# Patient Record
Sex: Male | Born: 1970 | Race: White | Hispanic: No | Marital: Single | Smoking: Never smoker
Health system: Southern US, Community
[De-identification: ages and names within clinical notes are randomized; demographics above are authoritative.]

## PROBLEM LIST (undated history)

## (undated) DIAGNOSIS — E782 Mixed hyperlipidemia: Secondary | ICD-10-CM

## (undated) DIAGNOSIS — I219 Acute myocardial infarction, unspecified: Secondary | ICD-10-CM

## (undated) DIAGNOSIS — I10 Essential (primary) hypertension: Secondary | ICD-10-CM

## (undated) DIAGNOSIS — I5020 Unspecified systolic (congestive) heart failure: Secondary | ICD-10-CM

## (undated) DIAGNOSIS — I255 Ischemic cardiomyopathy: Secondary | ICD-10-CM

## (undated) DIAGNOSIS — I214 Non-ST elevation (NSTEMI) myocardial infarction: Secondary | ICD-10-CM

## (undated) DIAGNOSIS — R569 Unspecified convulsions: Secondary | ICD-10-CM

## (undated) DIAGNOSIS — E119 Type 2 diabetes mellitus without complications: Secondary | ICD-10-CM

## (undated) DIAGNOSIS — I2510 Atherosclerotic heart disease of native coronary artery without angina pectoris: Secondary | ICD-10-CM

## (undated) HISTORY — PX: SKIN GRAFT: SHX250

## (undated) HISTORY — PX: BACK SURGERY: SHX140

---

## 2008-12-21 ENCOUNTER — Emergency Department (HOSPITAL_COMMUNITY): Admission: EM | Admit: 2008-12-21 | Discharge: 2008-12-21 | Payer: Self-pay | Admitting: Emergency Medicine

## 2010-02-02 ENCOUNTER — Encounter: Payer: Self-pay | Admitting: Emergency Medicine

## 2010-02-02 ENCOUNTER — Inpatient Hospital Stay (HOSPITAL_COMMUNITY)
Admission: AD | Admit: 2010-02-02 | Discharge: 2010-02-04 | Payer: Self-pay | Source: Home / Self Care | Admitting: Internal Medicine

## 2010-02-07 ENCOUNTER — Ambulatory Visit: Payer: Self-pay | Admitting: Dentistry

## 2010-06-26 LAB — GLUCOSE, CAPILLARY
Glucose-Capillary: 168 mg/dL — ABNORMAL HIGH (ref 70–99)
Glucose-Capillary: 170 mg/dL — ABNORMAL HIGH (ref 70–99)
Glucose-Capillary: 239 mg/dL — ABNORMAL HIGH (ref 70–99)
Glucose-Capillary: 245 mg/dL — ABNORMAL HIGH (ref 70–99)
Glucose-Capillary: 277 mg/dL — ABNORMAL HIGH (ref 70–99)

## 2010-06-26 LAB — URINALYSIS, ROUTINE W REFLEX MICROSCOPIC
Glucose, UA: >1000 mg/dL — AB
Leukocytes, UA: NEGATIVE
Nitrite: NEGATIVE
Specific Gravity, Urine: 1.015 (ref 1.005–1.030)
pH: 5.5 (ref 5.0–8.0)

## 2010-06-26 LAB — CBC
HCT: 47.1 % (ref 39.0–52.0)
Hemoglobin: 15.9 g/dL (ref 13.0–17.0)
MCV: 82 fL (ref 78.0–100.0)
Platelets: 108 10*3/uL — ABNORMAL LOW (ref 150–400)
RBC: 5.21 MIL/uL (ref 4.22–5.81)
RDW: 12.8 % (ref 11.5–15.5)
WBC: 5.5 10*3/uL (ref 4.0–10.5)
WBC: 7.2 10*3/uL (ref 4.0–10.5)

## 2010-06-26 LAB — BASIC METABOLIC PANEL
Calcium: 8.9 mg/dL (ref 8.4–10.5)
Chloride: 109 mEq/L (ref 96–112)
Creatinine, Ser: 0.77 mg/dL (ref 0.4–1.5)
GFR calc Af Amer: >60 mL/min (ref >60)
GFR calc non Af Amer: >60 mL/min (ref >60)
Potassium: 4.6 mEq/L (ref 3.5–5.1)
Sodium: 137 mEq/L (ref 135–145)
Sodium: 138 mEq/L (ref 135–145)

## 2010-06-26 LAB — DIFFERENTIAL
Basophils Absolute: 0 10*3/uL (ref 0.0–0.1)
Lymphocytes Relative: 15 % (ref 12–46)
Monocytes Absolute: 0 10*3/uL — ABNORMAL LOW (ref 0.1–1.0)
Monocytes Relative: 0 % — ABNORMAL LOW (ref 3–12)
Neutro Abs: 4.6 10*3/uL (ref 1.7–7.7)
Neutrophils Relative %: 84 % — ABNORMAL HIGH (ref 43–77)

## 2010-06-26 LAB — URINE CULTURE: Culture  Setup Time: 201110222024

## 2010-06-26 LAB — CULTURE, BLOOD (ROUTINE X 2)
Culture  Setup Time: 201110250030
Culture: NO GROWTH

## 2010-06-26 LAB — LIPID PANEL: Cholesterol: 130 mg/dL (ref 0–200)

## 2010-06-26 LAB — URINE MICROSCOPIC-ADD ON

## 2010-06-26 LAB — LACTIC ACID, PLASMA: Lactic Acid, Venous: 8.5 mmol/L — ABNORMAL HIGH (ref 0.5–2.2)

## 2010-06-26 LAB — MRSA PCR SCREENING: MRSA by PCR: NEGATIVE

## 2011-10-07 IMAGING — CR DG CHEST 1V PORT
1 series · 1 of 1 positions shown · non-contrast
Comparison: None

CLINICAL DATA: Shortness of breath and chills.

PORTABLE CHEST - 1 VIEW

[view not recorded]
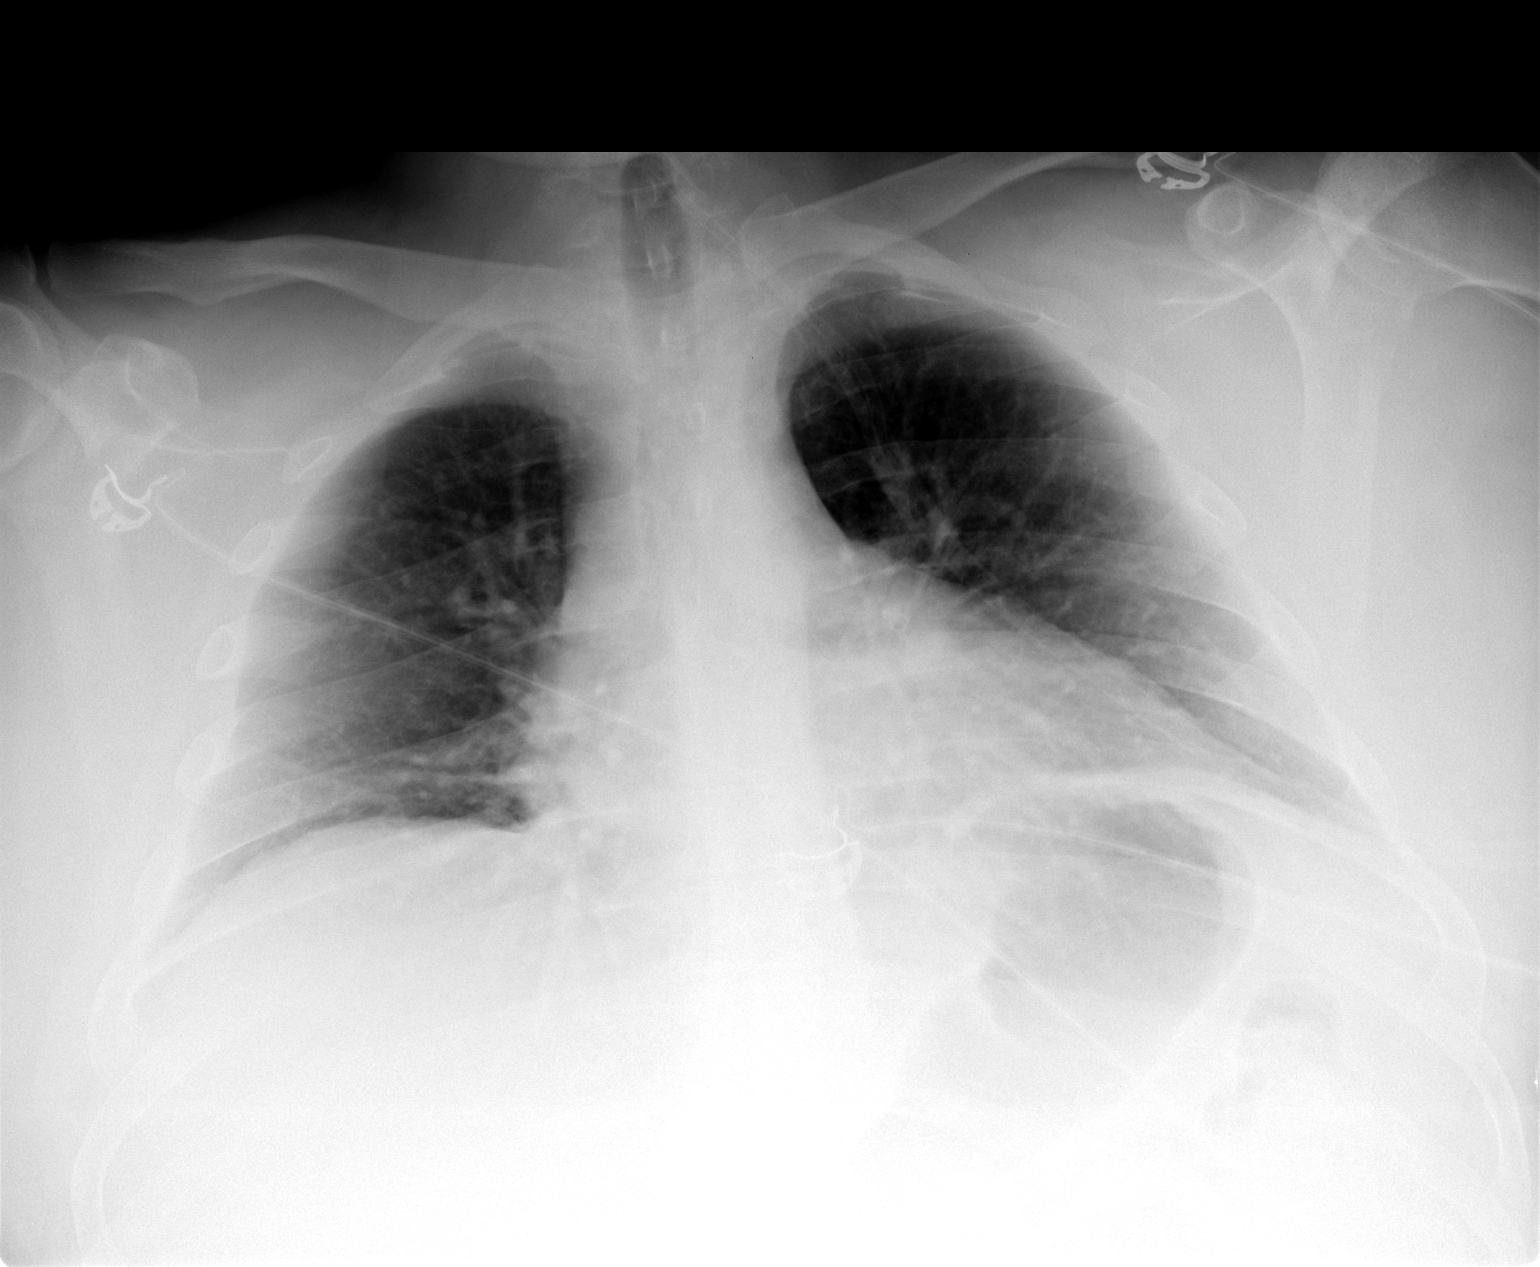

[1 of 1 positions shown; findings below may reference images not displayed]

FINDINGS: The heart is enlarged.  The mediastinal and hilar
contours are within normal limits.  Low lung volumes with mild
vascular crowding and bibasilar atelectasis.  No infiltrates, edema
or effusions.  No pneumothorax.  The bony thorax is intact.
IMPRESSION: 1.  Mild cardiac enlargement.
2.  No acute pulmonary findings.

## 2015-01-04 ENCOUNTER — Emergency Department: Payer: BLUE CROSS/BLUE SHIELD

## 2015-01-04 ENCOUNTER — Encounter: Payer: Self-pay | Admitting: Emergency Medicine

## 2015-01-04 DIAGNOSIS — E119 Type 2 diabetes mellitus without complications: Secondary | ICD-10-CM | POA: Diagnosis not present

## 2015-01-04 DIAGNOSIS — I10 Essential (primary) hypertension: Secondary | ICD-10-CM | POA: Diagnosis not present

## 2015-01-04 DIAGNOSIS — Y9389 Activity, other specified: Secondary | ICD-10-CM | POA: Diagnosis not present

## 2015-01-04 DIAGNOSIS — Y998 Other external cause status: Secondary | ICD-10-CM | POA: Diagnosis not present

## 2015-01-04 DIAGNOSIS — S62302A Unspecified fracture of third metacarpal bone, right hand, initial encounter for closed fracture: Secondary | ICD-10-CM | POA: Diagnosis not present

## 2015-01-04 DIAGNOSIS — S6991XA Unspecified injury of right wrist, hand and finger(s), initial encounter: Secondary | ICD-10-CM | POA: Diagnosis present

## 2015-01-04 DIAGNOSIS — X58XXXA Exposure to other specified factors, initial encounter: Secondary | ICD-10-CM | POA: Diagnosis not present

## 2015-01-04 DIAGNOSIS — Y9289 Other specified places as the place of occurrence of the external cause: Secondary | ICD-10-CM | POA: Diagnosis not present

## 2015-01-04 NOTE — ED Notes (Signed)
Pt presents to ED with possible dislocation to the knuckle on the 3rd digit on top of his right hand. Pt states he went to "thump" his son and when he pulled his finger down the knuckle pulled to the left and is very painful. Pain increases with movement.

## 2015-01-05 ENCOUNTER — Emergency Department
Admission: EM | Admit: 2015-01-05 | Discharge: 2015-01-05 | Disposition: A | Discharge status: Home / Self Care | Payer: BLUE CROSS/BLUE SHIELD | Attending: Emergency Medicine | Admitting: Emergency Medicine

## 2015-01-05 ENCOUNTER — Telehealth: Payer: Self-pay | Admitting: Medical Oncology

## 2015-01-05 DIAGNOSIS — X58XXXA Exposure to other specified factors, initial encounter: Secondary | ICD-10-CM | POA: Insufficient documentation

## 2015-01-05 DIAGNOSIS — Y9289 Other specified places as the place of occurrence of the external cause: Secondary | ICD-10-CM | POA: Insufficient documentation

## 2015-01-05 DIAGNOSIS — Y9389 Activity, other specified: Secondary | ICD-10-CM | POA: Insufficient documentation

## 2015-01-05 DIAGNOSIS — I10 Essential (primary) hypertension: Secondary | ICD-10-CM | POA: Insufficient documentation

## 2015-01-05 DIAGNOSIS — Y998 Other external cause status: Secondary | ICD-10-CM | POA: Insufficient documentation

## 2015-01-05 DIAGNOSIS — S62302A Unspecified fracture of third metacarpal bone, right hand, initial encounter for closed fracture: Secondary | ICD-10-CM

## 2015-01-05 DIAGNOSIS — E119 Type 2 diabetes mellitus without complications: Secondary | ICD-10-CM | POA: Insufficient documentation

## 2015-01-05 HISTORY — DX: Essential (primary) hypertension: I10

## 2015-01-05 HISTORY — DX: Unspecified convulsions: R56.9

## 2015-01-05 HISTORY — DX: Type 2 diabetes mellitus without complications: E11.9

## 2015-01-05 MED ORDER — OXYCODONE-ACETAMINOPHEN 5-325 MG PO TABS: 1.0000 | ORAL_TABLET | Freq: Once | ORAL | Status: DC

## 2015-01-05 MED ORDER — OXYCODONE-ACETAMINOPHEN 5-325 MG PO TABS
1.0000 | ORAL_TABLET | Freq: Once | ORAL | Status: AC
  Administered 2015-01-05: 1 via ORAL
  Filled 2015-01-05: qty 1

## 2015-01-05 NOTE — ED Notes (Signed)
Pt here with pain to his right hand. Pt states that he was playing with his son PTA and that he felt it pop. Pt stats that he has pain when bending his finger. Pt does not have any noted swelling. Pt is in NAD at this time.

## 2015-01-05 NOTE — Discharge Instructions (Signed)
Hand Fracture, Metacarpals °Fractures of metacarpals are breaks in the bones of the hand. They extend from the knuckles to the wrist. These bones can undergo many types of fractures. There are different ways of treating these fractures, all of which may be correct. °TREATMENT  °Hand fractures can be treated with:  °· Non-reduction - The fracture is casted without changing the positions of the fracture (bone pieces) involved. This fracture is usually left in a cast for 4 to 6 weeks or as your caregiver thinks necessary. °· Closed reduction - The bones are moved back into position without surgery and then casted. °· ORIF (open reduction and internal fixation) - The fracture site is opened and the bone pieces are fixed into place with some type of hardware, such as screws, etc. They are then casted. °Your caregiver will discuss the type of fracture you have and the treatment that should be best for that problem. If surgery is chosen, let your caregivers know about the following.  °LET YOUR CAREGIVERS KNOW ABOUT: °· Allergies. °· Medications you are taking, including herbs, eye drops, over the counter medications, and creams. °· Use of steroids (by mouth or creams). °· Previous problems with anesthetics or novocaine. °· Possibility of pregnancy. °· History of blood clots (thrombophlebitis). °· History of bleeding or blood problems. °· Previous surgeries. °· Other health problems. °AFTER THE PROCEDURE °After surgery, you will be taken to the recovery area where a nurse will watch and check your progress. Once you are awake, stable, and taking fluids well, barring other problems, you'll be allowed to go home. Once home, an ice pack applied to your operative site may help with pain and keep the swelling down. °HOME CARE INSTRUCTIONS  °· Follow your caregiver's instructions as to activities, exercises, physical therapy, and driving a car. °· Daily exercise is helpful for keeping range of motion and strength. Exercise as  instructed. °· To lessen swelling, keep the injured hand elevated above the level of your heart as much as possible. °· Apply ice to the injury for 15-20 minutes each hour while awake for the first 2 days. Put the ice in a plastic bag and place a thin towel between the bag of ice and your cast. °· Move the fingers of your casted hand several times a day. °· If a plaster or fiberglass cast was applied: °¨ Do not try to scratch the skin under the cast using a sharp or pointed object. °¨ Check the skin around the cast every day. You may put lotion on red or sore areas. °¨ Keep your cast dry. Your cast can be protected during bathing with a plastic bag. Do not put your cast into the water. °· If a plaster splint was applied: °¨ Wear your splint for as long as directed by your caregiver or until seen again. °¨ Do not get your splint wet. Protect it during bathing with a plastic bag. °¨ You may loosen the elastic bandage around the splint if your fingers start to get numb, tingle, get cold or turn blue. °· Do not put pressure on your cast or splint; this may cause it to break. Especially, do not lean plaster casts on hard surfaces for 24 hours after application. °· Take medications as directed by your caregiver. °· Only take over-the-counter or prescription medicines for pain, discomfort, or fever as directed by your caregiver. °· Follow-up as provided by your caregiver. This is very important in order to avoid permanent injury or disability and chronic   pain. °SEEK MEDICAL CARE IF:  °· Increased bleeding (more than a small spot) from beneath your cast or splint if there is beneath the cast as with an open reduction. °· Redness, swelling, or increasing pain in the wound or from beneath your cast or splint. °· Pus coming from wound or from beneath your cast or splint. °· An unexplained oral temperature above 102° F (38.9° C) develops, or as your caregiver suggests. °· A foul smell coming from the wound or dressing or from  beneath your cast or splint. °· You have a problem moving any of your fingers. °SEEK IMMEDIATE MEDICAL CARE IF:  °· You develop a rash °· You have difficulty breathing °· You have any allergy problems °If you do not have a window in your cast for observing the wound, a discharge or minor bleeding may show up as a stain on the outside of your cast. Report these findings to your caregiver. °MAKE SURE YOU:  °· Understand these instructions. °· Will watch your condition. °· Will get help right away if you are not doing well or get worse. °Document Released: 03/31/2005 Document Revised: 06/23/2011 Document Reviewed: 11/18/2007 °[employer]® Patient Information ©2015 [employer]. This information is not intended to replace advice given to you by your health care provider. Make sure you discuss any questions you have with your health care provider. ° °

## 2015-01-05 NOTE — ED Provider Notes (Signed)
[facility] Emergency Department Provider Note  ____________________________________________  Time seen: 1:30 AM  I have reviewed the triage vital signs and the nursing notes.   HISTORY  Chief Complaint Hand Injury      HPI Douglas Young is a 44 y.o. male presents with history of injuring his third knuckle while attempting to "thump" his son. Patient admits to currently 7 out of 10 pain located at the proximal portion of the third finger. Pain is worsened with making a fist.    Past Medical History  Diagnosis Date  . Diabetes mellitus without complication   . Hypertension   . Seizures     There are no active problems to display for this patient.   Past Surgical History  Procedure Laterality Date  . Skin graft    . Back surgery      Current Outpatient Rx  Name  Route  Sig  Dispense  Refill  . oxyCODONE-acetaminophen (PERCOCET/ROXICET) 5-325 MG per tablet   Oral   Take 1 tablet by mouth once.   20 tablet   0     Allergies Prednisone  No family history on file.  Social History Social History  Substance Use Topics  . Smoking status: Never Smoker   . Smokeless tobacco: Current User  . Alcohol Use: No    Review of Systems  Constitutional: Negative for fever. Eyes: Negative for visual changes. ENT: Negative for sore throat. Cardiovascular: Negative for chest pain. Respiratory: Negative for shortness of breath. Gastrointestinal: Negative for abdominal pain, vomiting and diarrhea. Genitourinary: Negative for dysuria. Musculoskeletal: Positive for right third finger pain Skin: Negative for rash. Neurological: Negative for headaches, focal weakness or numbness.   10-point ROS otherwise negative.  ____________________________________________   PHYSICAL EXAM:  VITAL SIGNS: ED Triage Vitals  Enc Vitals Group     BP 01/04/15 2334 168/94 mmHg     Pulse Rate 01/04/15 2334 80     Resp 01/04/15 2334 20     Temp 01/04/15 2334  98.3 F (36.8 C)     Temp Source 01/04/15 2334 Oral     SpO2 01/04/15 2334 98 %     Weight 01/04/15 2334 230 lb (104.327 kg)     Height 01/04/15 2334  (1.778 m)     Head Cir --      Peak Flow --      Pain Score 01/04/15 2335 1     Pain Loc --      Pain Edu? --      Excl. in GC? --      Constitutional: Alert and oriented. Well appearing and in no distress. Eyes: Conjunctivae are normal. PERRL. Normal extraocular movements. ENT   Head: Normocephalic and atraumatic.   Nose: No congestion/rhinnorhea.   Mouth/Throat: Mucous membranes are moist.   Neck: No stridor. Musculoskeletal: Deformity noted at the base of the third finger unusual contour to the knuckle as well as noted deviation of the tendon with making a fist.  Neurologic:  Normal speech and language. No Payeton Germani focal neurologic deficits are appreciated. Speech is normal.  Skin:  Skin is warm, dry and intact. No rash noted. Psychiatric: Mood and affect are normal. Speech and behavior are normal. Patient exhibits appropriate insight and judgment.  ____________________________________________    LABS (pertinent positives/negatives)     _______ Hand Complete Right (Final result) Result time: 01/05/15 00:27:41   Final result by Rad Results In Interface (01/05/15 00:27:41)   Narrative:   CLINICAL DATA: Injury  to third digit RIGHT hand, question dislocation, pain increased with movement  EXAM: RIGHT HAND - COMPLETE 3+ VIEW  COMPARISON: None  FINDINGS: Osseous mineralization normal.  Joint spaces preserved.  No fracture, dislocation, or bone destruction.  IMPRESSION: Normal exam.   Electronically Signed By: Ulyses Southward M.D. On: 01/05/2015 00:27     INITIAL IMPRESSION / ASSESSMENT AND PLAN / ED COURSE  Pertinent labs & imaging results that were available during my care of the patient were reviewed by me and considered in my medical decision making (see chart for  details).  Splint applied to the third finger of the right hand. Although x-ray did not reveal any fracture or dislocation clinically the patient has a fracture of his distal third metacarpal with tendon deviation. As such patient is being referred to Dr. Martha Clan orthopedic surgeon for further evaluation.  ____________________________________________   FINAL CLINICAL IMPRESSION(S) / ED DIAGNOSES  Final diagnoses:  Fracture of third metacarpal bone of right hand, closed, initial encounter      Darci Current, MD 01/05/15 [phone]

## 2016-09-07 IMAGING — CR DG HAND COMPLETE 3+V*R*
1 series · 3 of 3 positions shown · non-contrast
Comparison: None

CLINICAL DATA: Injury to third digit RIGHT hand, question
dislocation, pain increased with movement

EXAM:
RIGHT HAND - COMPLETE 3+ VIEW

[Series 1: x hand pa right · 0.14mm/px · 3 of 3 slices shown]
[im 1/3]
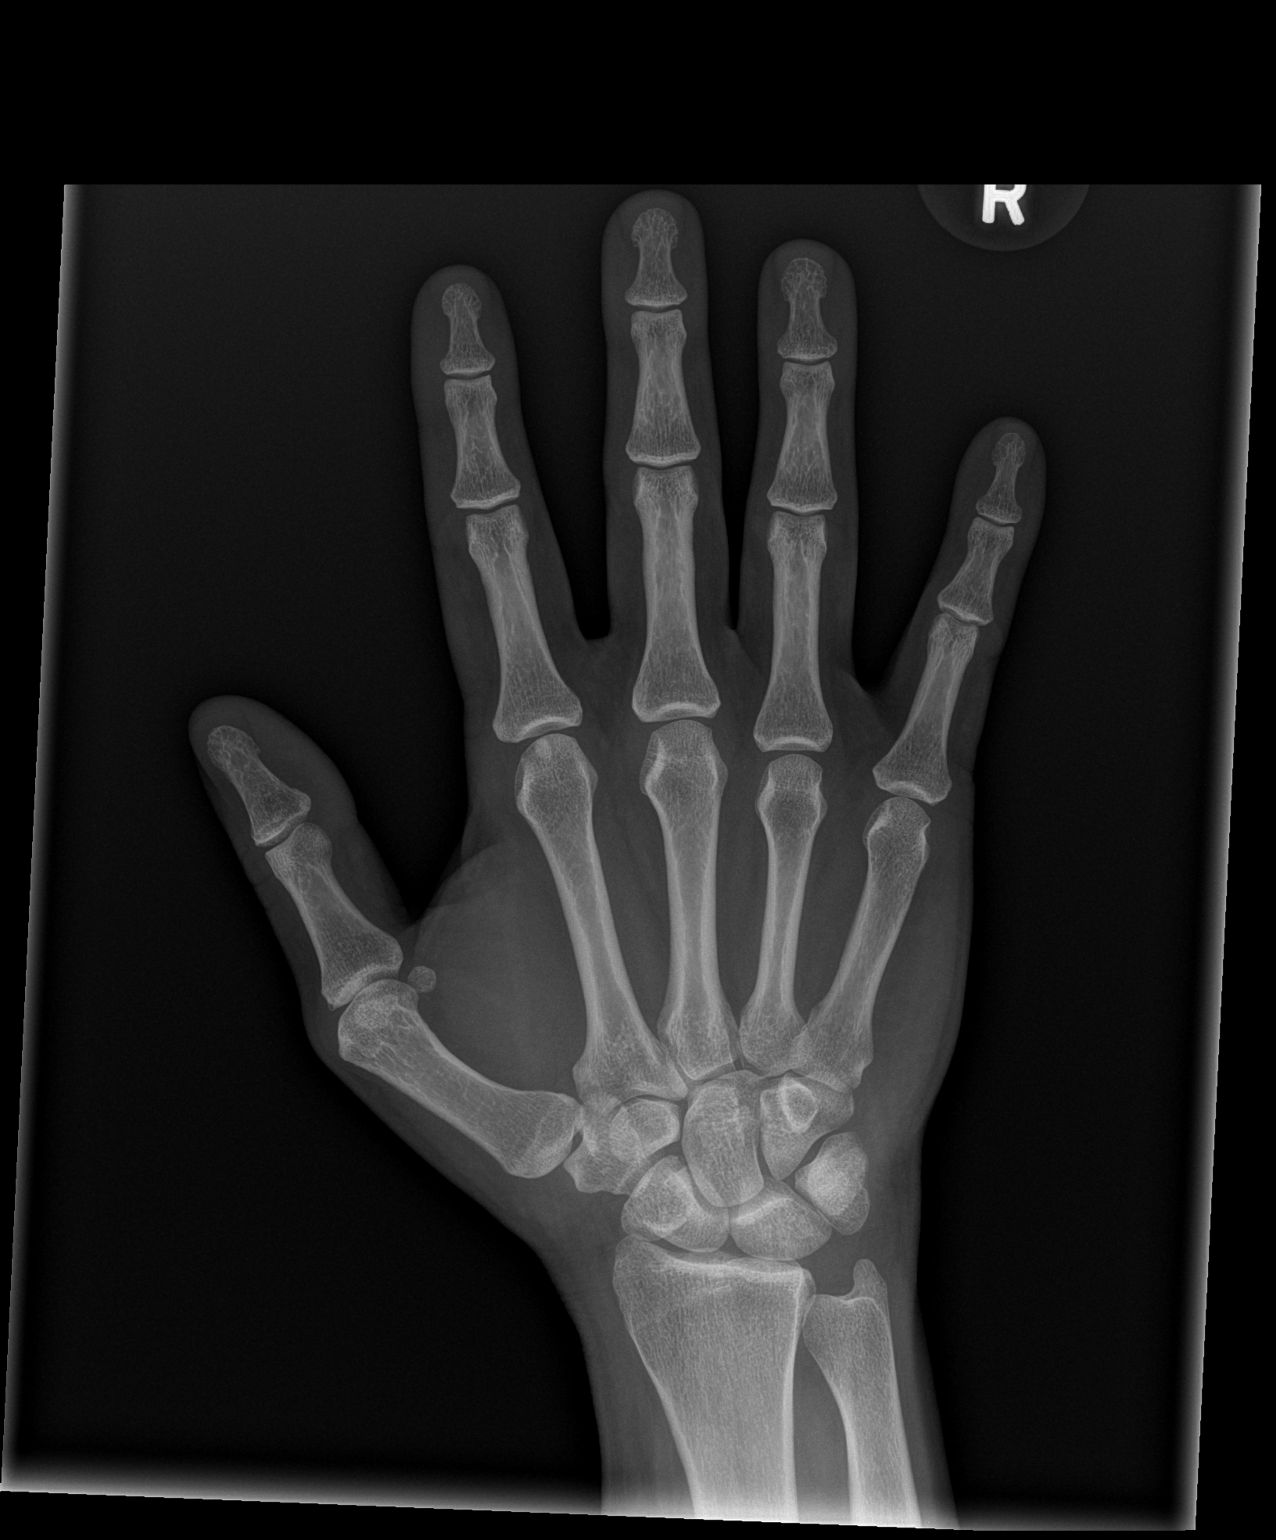
[im 2/3]
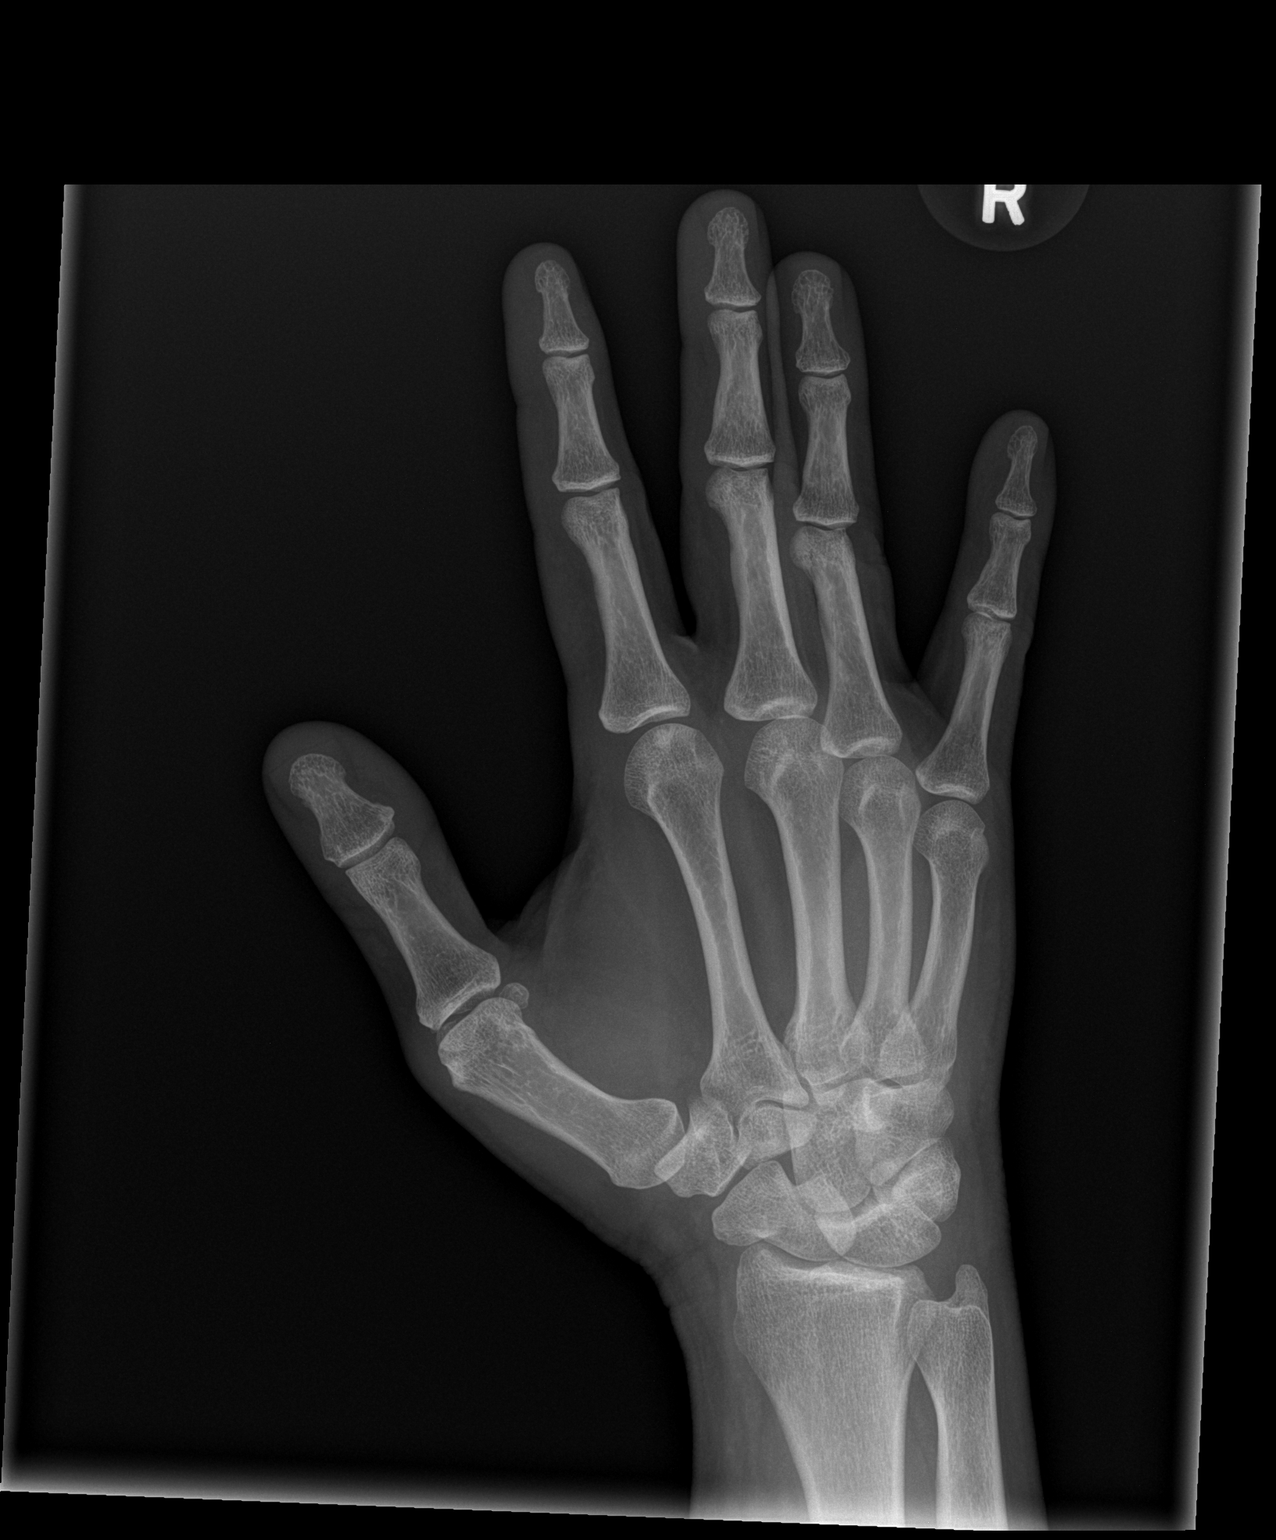
[im 3/3]
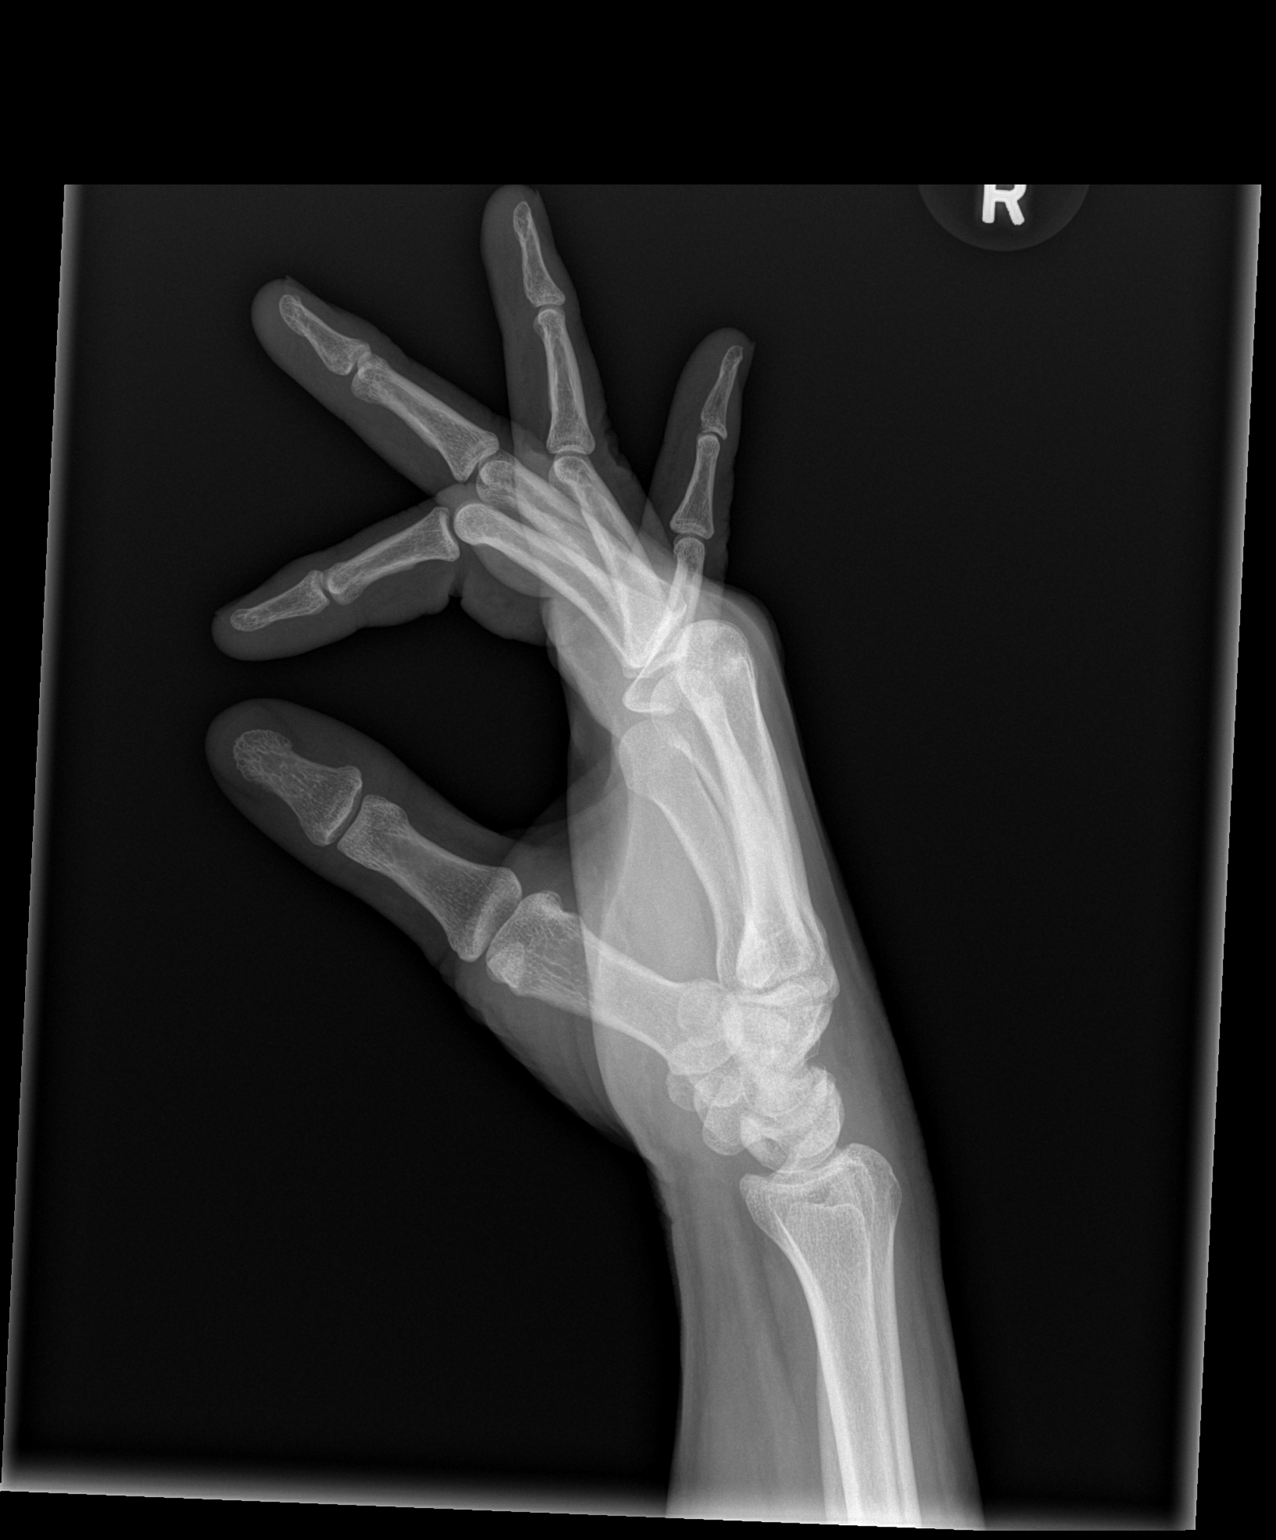

[3 of 3 positions shown; findings below may reference images not displayed]

FINDINGS: Osseous mineralization normal.

Joint spaces preserved.

No fracture, dislocation, or bone destruction.
IMPRESSION: Normal exam.

## 2018-10-14 ENCOUNTER — Encounter: Payer: Self-pay | Admitting: Emergency Medicine

## 2018-10-14 ENCOUNTER — Other Ambulatory Visit: Payer: Self-pay

## 2018-10-14 ENCOUNTER — Emergency Department
Admission: EM | Admit: 2018-10-14 | Discharge: 2018-10-14 | Disposition: A | Discharge status: Home / Self Care | Payer: BLUE CROSS/BLUE SHIELD | Attending: Emergency Medicine | Admitting: Emergency Medicine

## 2018-10-14 DIAGNOSIS — Z7984 Long term (current) use of oral hypoglycemic drugs: Secondary | ICD-10-CM | POA: Insufficient documentation

## 2018-10-14 DIAGNOSIS — S70262A Insect bite (nonvenomous), left hip, initial encounter: Secondary | ICD-10-CM | POA: Insufficient documentation

## 2018-10-14 DIAGNOSIS — Y9389 Activity, other specified: Secondary | ICD-10-CM | POA: Insufficient documentation

## 2018-10-14 DIAGNOSIS — I10 Essential (primary) hypertension: Secondary | ICD-10-CM | POA: Insufficient documentation

## 2018-10-14 DIAGNOSIS — E119 Type 2 diabetes mellitus without complications: Secondary | ICD-10-CM | POA: Insufficient documentation

## 2018-10-14 DIAGNOSIS — Y998 Other external cause status: Secondary | ICD-10-CM | POA: Insufficient documentation

## 2018-10-14 DIAGNOSIS — W57XXXA Bitten or stung by nonvenomous insect and other nonvenomous arthropods, initial encounter: Secondary | ICD-10-CM | POA: Insufficient documentation

## 2018-10-14 DIAGNOSIS — R42 Dizziness and giddiness: Secondary | ICD-10-CM | POA: Insufficient documentation

## 2018-10-14 DIAGNOSIS — Y929 Unspecified place or not applicable: Secondary | ICD-10-CM | POA: Insufficient documentation

## 2018-10-14 DIAGNOSIS — R739 Hyperglycemia, unspecified: Secondary | ICD-10-CM

## 2018-10-14 DIAGNOSIS — F17228 Nicotine dependence, chewing tobacco, with other nicotine-induced disorders: Secondary | ICD-10-CM | POA: Insufficient documentation

## 2018-10-14 LAB — COMPREHENSIVE METABOLIC PANEL
ALT: 18 U/L (ref 0–44)
AST: 13 U/L — ABNORMAL LOW (ref 15–41)
Albumin: 4.4 g/dL (ref 3.5–5.0)
Alkaline Phosphatase: 76 U/L (ref 38–126)
Anion gap: 10 (ref 5–15)
BUN: 13 mg/dL (ref 6–20)
CO2: 23 mmol/L (ref 22–32)
Calcium: 9.4 mg/dL (ref 8.9–10.3)
Chloride: 103 mmol/L (ref 98–111)
Creatinine, Ser: 0.84 mg/dL (ref 0.61–1.24)
GFR calc Af Amer: >60 mL/min (ref >60)
GFR calc non Af Amer: >60 mL/min (ref >60)
Glucose, Bld: 259 mg/dL — ABNORMAL HIGH (ref 70–99)
Potassium: 4.3 mmol/L (ref 3.5–5.1)
Sodium: 136 mmol/L (ref 135–145)
Total Bilirubin: 0.6 mg/dL (ref 0.3–1.2)
Total Protein: 7.6 g/dL (ref 6.5–8.1)

## 2018-10-14 LAB — CBC WITH DIFFERENTIAL/PLATELET
Abs Immature Granulocytes: 0.06 10*3/uL (ref 0.00–0.07)
Basophils Absolute: 0 10*3/uL (ref 0.0–0.1)
Basophils Relative: 1 %
Eosinophils Absolute: 0.1 10*3/uL (ref 0.0–0.5)
Eosinophils Relative: 1 %
HCT: 49.2 % (ref 39.0–52.0)
Hemoglobin: 16.3 g/dL (ref 13.0–17.0)
Immature Granulocytes: 1 %
Lymphocytes Relative: 25 %
Lymphs Abs: 2.2 10*3/uL (ref 0.7–4.0)
MCH: 26.9 pg (ref 26.0–34.0)
MCHC: 33.1 g/dL (ref 30.0–36.0)
MCV: 81.2 fL (ref 80.0–100.0)
Monocytes Absolute: 0.5 10*3/uL (ref 0.1–1.0)
Monocytes Relative: 6 %
Neutro Abs: 6 10*3/uL (ref 1.7–7.7)
Neutrophils Relative %: 66 %
Platelets: 273 10*3/uL (ref 150–400)
RBC: 6.06 MIL/uL — ABNORMAL HIGH (ref 4.22–5.81)
RDW: 13.1 % (ref 11.5–15.5)
WBC: 8.8 10*3/uL (ref 4.0–10.5)
nRBC: 0 % (ref 0.0–0.2)

## 2018-10-14 LAB — URINALYSIS, COMPLETE (UACMP) WITH MICROSCOPIC
Bacteria, UA: NONE SEEN
Bilirubin Urine: NEGATIVE
Glucose, UA: NEGATIVE mg/dL
Hgb urine dipstick: NEGATIVE
Ketones, ur: NEGATIVE mg/dL
Leukocytes,Ua: NEGATIVE
Nitrite: NEGATIVE
Protein, ur: 30 mg/dL — AB
Specific Gravity, Urine: 1.02 (ref 1.005–1.030)
Squamous Epithelial / LPF: NONE SEEN (ref 0–5)
WBC, UA: NONE SEEN WBC/hpf (ref 0–5)
pH: 8 (ref 5.0–8.0)

## 2018-10-14 LAB — GLUCOSE, CAPILLARY: Glucose-Capillary: 169 mg/dL — ABNORMAL HIGH (ref 70–99)

## 2018-10-14 MED ORDER — SODIUM CHLORIDE 0.9% FLUSH: 3.0000 mL | Freq: Once | INTRAVENOUS | Status: DC

## 2018-10-14 MED ORDER — METFORMIN HCL 500 MG PO TABS: 500.0000 mg | ORAL_TABLET | Freq: Two times a day (BID) | ORAL | 1 refills | Status: DC

## 2018-10-14 MED ORDER — SODIUM CHLORIDE 0.9 % IV BOLUS
1000.0000 mL | Freq: Once | INTRAVENOUS | Status: AC
  Administered 2018-10-14: 1000 mL via INTRAVENOUS

## 2018-10-14 MED ORDER — DOXYCYCLINE MONOHYDRATE 100 MG PO TABS: 100.0000 mg | ORAL_TABLET | Freq: Two times a day (BID) | ORAL | 0 refills | Status: AC

## 2018-10-14 NOTE — ED Notes (Signed)
See triage note  Presents stating that he just doesn't feel well  Felling a little dizzy  States he did find a tick couple of days ago  Denies any n/v/d  Or cough

## 2018-10-14 NOTE — ED Triage Notes (Signed)
Pt presents to ED via POV with c/o dizziness x 3 days. Pt states dizziness only occurs when he closes his eyes. Pt states found a tick on L hip earlier today. Pt states removed tick PTA, states concern for Lyme disease. Pt A&O x4 , NAD Noted at this time, states hx of HTN, but has not been on BP meds in "years".

## 2018-10-14 NOTE — ED Provider Notes (Signed)
[facility] Emergency Department Provider Note  ____________________________________________  Time seen: Approximately 5:41 PM  I have reviewed the triage vital signs and the nursing notes.   HISTORY  Chief Complaint Dizziness    HPI Douglas Young is a 48 y.o. male with a history of diabetes and hypertension, presents to the emergency department with a sensation of dizziness when patient closes his eyes when he is standing.  Patient states that when he is supine and closes his eyes, he does not experience dizziness.  Patient denies recent illness.  No headache, chest pain, chest tightness, shortness of breath or abdominal pain.  Patient states that he does not take medications chronically for diabetes and has been controlling diabetes with diet and lifestyle changes alone. No other alleviating measures have been attempted.         Past Medical History:  Diagnosis Date  . Diabetes mellitus without complication (HCC)   . Hypertension   . Seizures (HCC)     There are no active problems to display for this patient.   Past Surgical History:  Procedure Laterality Date  . BACK SURGERY    . SKIN GRAFT      Prior to Admission medications   Medication Sig Start Date End Date Taking? Authorizing Provider  doxycycline (ADOXA) 100 MG tablet Take 1 tablet (100 mg total) by mouth 2 (two) times daily for 7 days. 10/14/18 10/21/18  Orvil FeilWoods, Aloise Copus M, PA-C  metFORMIN (GLUCOPHAGE) 500 MG tablet Take 1 tablet (500 mg total) by mouth 2 (two) times daily with a meal. 10/14/18 11/13/18  Orvil FeilWoods, Marcie Shearon M, PA-C    Allergies Prednisone  No family history on file.  Social History Social History   Tobacco Use  . Smoking status: Never Smoker  . Smokeless tobacco: Current User  Substance Use Topics  . Alcohol use: No  . Drug use: Not on file     Review of Systems  Constitutional: No fever/chills Eyes: No visual changes. No discharge ENT: No upper respiratory  complaints. Cardiovascular: no chest pain. Respiratory: no cough. No SOB. Gastrointestinal: No abdominal pain.  No nausea, no vomiting.  No diarrhea.  No constipation. Genitourinary: Negative for dysuria. No hematuria Musculoskeletal: Negative for musculoskeletal pain. Skin: Negative for rash, abrasions, lacerations, ecchymosis. Neurological: Negative for headaches, focal weakness or numbness.   ____________________________________________   PHYSICAL EXAM:  VITAL SIGNS: ED Triage Vitals [10/14/18 1417]  Enc Vitals Group     BP (!) 169/110     Pulse Rate 97     Resp 18     Temp 99.1 F (37.3 C)     Temp Source Oral     SpO2 97 %     Weight 215 lb (97.5 kg)     Height 5\' 10"  (1.778 m)     Head Circumference      Peak Flow      Pain Score 0     Pain Loc      Pain Edu?      Excl. in GC?      Constitutional: Alert and oriented. Well appearing and in no acute distress. Eyes: Conjunctivae are normal. PERRL. EOMI. Head: Atraumatic. ENT:      Nose: No congestion/rhinnorhea.      Mouth/Throat: Mucous membranes are moist.  Neck: No stridor.  No cervical spine tenderness to palpation. Cardiovascular: Normal rate, regular rhythm. Normal S1 and S2.  Good peripheral circulation. Respiratory: Normal respiratory effort without tachypnea or retractions. Lungs CTAB. Good air entry  to the bases with no decreased or absent breath sounds. Gastrointestinal: Bowel sounds 4 quadrants. Soft and nontender to palpation. No guarding or rigidity. No palpable masses. No distention. No CVA tenderness. Musculoskeletal: Full range of motion to all extremities. No gross deformities appreciated. Neurologic:  Normal speech and language. No gross focal neurologic deficits are appreciated.  Skin:  Skin is warm, dry and intact. No rash noted. Psychiatric: Mood and affect are normal. Speech and behavior are normal. Patient exhibits appropriate insight and  judgement.   ____________________________________________   LABS (all labs ordered are listed, but only abnormal results are displayed)  Labs Reviewed  URINALYSIS, COMPLETE (UACMP) WITH MICROSCOPIC - Abnormal; Notable for the following components:      Result Value   Color, Urine YELLOW (*)    APPearance TURBID (*)    Protein, ur 30 (*)    All other components within normal limits  CBC WITH DIFFERENTIAL/PLATELET - Abnormal; Notable for the following components:   RBC 6.06 (*)    All other components within normal limits  COMPREHENSIVE METABOLIC PANEL - Abnormal; Notable for the following components:   Glucose, Bld 259 (*)    AST 13 (*)    All other components within normal limits  GLUCOSE, CAPILLARY - Abnormal; Notable for the following components:   Glucose-Capillary 169 (*)    All other components within normal limits   ____________________________________________  EKG   ____________________________________________  RADIOLOGY   No results found.  ____________________________________________    PROCEDURES  Procedure(s) performed:    Procedures    Medications  sodium chloride flush (NS) 0.9 % injection 3 mL (3 mLs Intravenous Not Given 10/14/18 1629)  sodium chloride 0.9 % bolus 1,000 mL (0 mLs Intravenous Stopped 10/14/18 1740)  sodium chloride 0.9 % bolus 1,000 mL (0 mLs Intravenous Stopped 10/14/18 1815)     ____________________________________________   INITIAL IMPRESSION / ASSESSMENT AND PLAN / ED COURSE  Pertinent labs & imaging results that were available during my care of the patient were reviewed by me and considered in my medical decision making (see chart for details).  Review of the [place] CSRS was performed in accordance of the [facility] prior to dispensing any controlled drugs.         Assessment and plan:  Dizziness Hyperglycemia Tick bite 48 year old male presents to the emergency department with a sensation of dizziness when he closes his  eyes while standing but not when he closes his eyes in a supine position.  Patient was mildly hypertensive but vital signs were otherwise reassuring.  Patient seemed to be resting comfortably in exam room.  He had no complaints while supine.    Differential diagnosis include benign positional vertigo versus symptomatic hyperglycemia  Initial blood glucose concerning at 259.  Basic labs are otherwise reassuring.  Patient was given supplemental fluids in the emergency department and blood glucose was 169.  I had patient stand in the emergency department and closes eyes and he reported improvement in dizziness.  Findings increase suspicion for symptomatic hypoglycemia.  Patient was started on metformin and was advised to follow-up with primary care.  Patient was also discharged with doxycycline for tick bite.  Return precautions were given.  All patient questions were answered.    ____________________________________________  FINAL CLINICAL IMPRESSION(S) / ED DIAGNOSES  Final diagnoses:  Dizziness  Hyperglycemia  Tick bite, initial encounter      NEW MEDICATIONS STARTED DURING THIS VISIT:  ED Discharge Orders         Ordered  metFORMIN (GLUCOPHAGE) 500 MG tablet  2 times daily with meals     10/14/18 1850    doxycycline (ADOXA) 100 MG tablet  2 times daily     10/14/18 1850              This chart was dictated using voice recognition software/Dragon. Despite best efforts to proofread, errors can occur which can change the meaning. Any change was purely unintentional.    Lannie Fields, PA-C 10/14/18 Rexene Agent, MD 10/15/18 Sheila Oats    Delman Kitten, MD 10/15/18 [phone]

## 2019-04-04 ENCOUNTER — Inpatient Hospital Stay
Admission: EM | Admit: 2019-04-04 | Discharge: 2019-04-06 | DRG: 247 | Disposition: A | Discharge status: Home / Self Care | Payer: BC Managed Care – PPO | Attending: Internal Medicine | Admitting: Internal Medicine

## 2019-04-04 ENCOUNTER — Emergency Department: Payer: BC Managed Care – PPO

## 2019-04-04 ENCOUNTER — Encounter: Payer: Self-pay | Admitting: Emergency Medicine

## 2019-04-04 ENCOUNTER — Other Ambulatory Visit: Payer: Self-pay

## 2019-04-04 DIAGNOSIS — E119 Type 2 diabetes mellitus without complications: Secondary | ICD-10-CM | POA: Diagnosis present

## 2019-04-04 DIAGNOSIS — R778 Other specified abnormalities of plasma proteins: Secondary | ICD-10-CM | POA: Diagnosis not present

## 2019-04-04 DIAGNOSIS — I214 Non-ST elevation (NSTEMI) myocardial infarction: Secondary | ICD-10-CM | POA: Diagnosis not present

## 2019-04-04 DIAGNOSIS — E785 Hyperlipidemia, unspecified: Secondary | ICD-10-CM | POA: Diagnosis present

## 2019-04-04 DIAGNOSIS — I10 Essential (primary) hypertension: Secondary | ICD-10-CM

## 2019-04-04 DIAGNOSIS — Z8249 Family history of ischemic heart disease and other diseases of the circulatory system: Secondary | ICD-10-CM

## 2019-04-04 DIAGNOSIS — Z20828 Contact with and (suspected) exposure to other viral communicable diseases: Secondary | ICD-10-CM | POA: Diagnosis present

## 2019-04-04 DIAGNOSIS — R079 Chest pain, unspecified: Secondary | ICD-10-CM | POA: Diagnosis present

## 2019-04-04 DIAGNOSIS — I119 Hypertensive heart disease without heart failure: Secondary | ICD-10-CM | POA: Diagnosis present

## 2019-04-04 DIAGNOSIS — I255 Ischemic cardiomyopathy: Secondary | ICD-10-CM | POA: Diagnosis present

## 2019-04-04 DIAGNOSIS — D72829 Elevated white blood cell count, unspecified: Secondary | ICD-10-CM | POA: Diagnosis present

## 2019-04-04 DIAGNOSIS — Z79899 Other long term (current) drug therapy: Secondary | ICD-10-CM

## 2019-04-04 DIAGNOSIS — G40909 Epilepsy, unspecified, not intractable, without status epilepticus: Secondary | ICD-10-CM | POA: Diagnosis present

## 2019-04-04 DIAGNOSIS — Z833 Family history of diabetes mellitus: Secondary | ICD-10-CM

## 2019-04-04 DIAGNOSIS — Z7984 Long term (current) use of oral hypoglycemic drugs: Secondary | ICD-10-CM

## 2019-04-04 LAB — CBC
HCT: 47.3 % (ref 39.0–52.0)
Hemoglobin: 16.1 g/dL (ref 13.0–17.0)
MCH: 27.2 pg (ref 26.0–34.0)
MCHC: 34 g/dL (ref 30.0–36.0)
MCV: 80 fL (ref 80.0–100.0)
Platelets: 323 10*3/uL (ref 150–400)
RBC: 5.91 MIL/uL — ABNORMAL HIGH (ref 4.22–5.81)
RDW: 11.9 % (ref 11.5–15.5)
WBC: 13.4 10*3/uL — ABNORMAL HIGH (ref 4.0–10.5)
nRBC: 0 % (ref 0.0–0.2)

## 2019-04-04 LAB — BASIC METABOLIC PANEL
Anion gap: 9 (ref 5–15)
BUN: 24 mg/dL — ABNORMAL HIGH (ref 6–20)
CO2: 25 mmol/L (ref 22–32)
Calcium: 9.4 mg/dL (ref 8.9–10.3)
Chloride: 94 mmol/L — ABNORMAL LOW (ref 98–111)
Creatinine, Ser: 1.05 mg/dL (ref 0.61–1.24)
GFR calc Af Amer: >60 mL/min (ref >60)
GFR calc non Af Amer: >60 mL/min (ref >60)
Glucose, Bld: 293 mg/dL — ABNORMAL HIGH (ref 70–99)
Potassium: 4.3 mmol/L (ref 3.5–5.1)
Sodium: 128 mmol/L — ABNORMAL LOW (ref 135–145)

## 2019-04-04 LAB — TROPONIN I (HIGH SENSITIVITY)
Troponin I (High Sensitivity): 19 ng/L — ABNORMAL HIGH (ref <18)
Troponin I (High Sensitivity): 159 ng/L (ref <18)

## 2019-04-04 MED ORDER — LISINOPRIL 20 MG PO TABS
20.0000 mg | ORAL_TABLET | Freq: Every day | ORAL | Status: DC
  Administered 2019-04-05 – 2019-04-06 (×2): 20 mg via ORAL
  Filled 2019-04-04 (×2): qty 1

## 2019-04-04 MED ORDER — HYDROCHLOROTHIAZIDE 25 MG PO TABS
12.5000 mg | ORAL_TABLET | Freq: Every day | ORAL | Status: DC
  Administered 2019-04-05 – 2019-04-06 (×2): 12.5 mg via ORAL
  Filled 2019-04-04 (×2): qty 1

## 2019-04-04 MED ORDER — ALUM & MAG HYDROXIDE-SIMETH 200-200-20 MG/5ML PO SUSP: 30.0000 mL | Freq: Once | ORAL | Status: DC

## 2019-04-04 MED ORDER — PRAVASTATIN SODIUM 40 MG PO TABS: 40.0000 mg | ORAL_TABLET | Freq: Every day | ORAL | Status: DC

## 2019-04-04 MED ORDER — METOPROLOL TARTRATE 25 MG PO TABS
25.0000 mg | ORAL_TABLET | Freq: Two times a day (BID) | ORAL | Status: DC
  Administered 2019-04-05 (×3): 25 mg via ORAL
  Filled 2019-04-04 (×3): qty 1

## 2019-04-04 MED ORDER — ASPIRIN EC 325 MG PO TBEC
325.0000 mg | DELAYED_RELEASE_TABLET | Freq: Every day | ORAL | Status: DC
  Administered 2019-04-05: 325 mg via ORAL
  Filled 2019-04-04: qty 1

## 2019-04-04 MED ORDER — ACETAMINOPHEN 325 MG PO TABS: 650.0000 mg | ORAL_TABLET | ORAL | Status: DC | PRN

## 2019-04-04 MED ORDER — ATORVASTATIN CALCIUM 20 MG PO TABS
40.0000 mg | ORAL_TABLET | Freq: Every day | ORAL | Status: DC
  Administered 2019-04-05 (×2): 40 mg via ORAL
  Filled 2019-04-04 (×2): qty 2

## 2019-04-04 MED ORDER — NITROGLYCERIN 0.4 MG/SPRAY TL SOLN
1.0000 | Status: DC | PRN
  Filled 2019-04-04: qty 4.9

## 2019-04-04 MED ORDER — LIDOCAINE VISCOUS HCL 2 % MT SOLN
15.0000 mL | Freq: Once | OROMUCOSAL | Status: DC
  Filled 2019-04-04: qty 15

## 2019-04-04 MED ORDER — MORPHINE SULFATE (PF) 2 MG/ML IV SOLN: 2.0000 mg | INTRAVENOUS | Status: DC | PRN

## 2019-04-04 MED ORDER — SODIUM CHLORIDE 0.9 % IV SOLN: INTRAVENOUS | Status: DC

## 2019-04-04 MED ORDER — ENOXAPARIN SODIUM 40 MG/0.4ML ~~LOC~~ SOLN: 40.0000 mg | SUBCUTANEOUS | Status: DC

## 2019-04-04 MED ORDER — ALPRAZOLAM 0.25 MG PO TABS: 0.2500 mg | ORAL_TABLET | Freq: Two times a day (BID) | ORAL | Status: DC | PRN

## 2019-04-04 MED ORDER — ONDANSETRON HCL 4 MG/2ML IJ SOLN: 4.0000 mg | Freq: Four times a day (QID) | INTRAMUSCULAR | Status: DC | PRN

## 2019-04-04 MED ORDER — INSULIN ASPART 100 UNIT/ML ~~LOC~~ SOLN
0.0000 [IU] | Freq: Three times a day (TID) | SUBCUTANEOUS | Status: DC
  Administered 2019-04-05: 3 [IU] via SUBCUTANEOUS
  Administered 2019-04-05 – 2019-04-06 (×2): 2 [IU] via SUBCUTANEOUS
  Administered 2019-04-06: 08:00:00 5 [IU] via SUBCUTANEOUS
  Filled 2019-04-04 (×4): qty 1

## 2019-04-04 MED ORDER — ZOLPIDEM TARTRATE 5 MG PO TABS: 5.0000 mg | ORAL_TABLET | Freq: Every evening | ORAL | Status: DC | PRN

## 2019-04-04 MED ORDER — ASPIRIN 81 MG PO CHEW
324.0000 mg | CHEWABLE_TABLET | Freq: Once | ORAL | Status: AC
  Administered 2019-04-04: 19:00:00 324 mg via ORAL
  Filled 2019-04-04: qty 4

## 2019-04-04 NOTE — ED Provider Notes (Signed)
[facility] Emergency Department Provider Note  ____________________________________________   I have reviewed the triage vital signs and the nursing notes.   HISTORY  Chief Complaint Chest Pain   History limited by: Not Limited   HPI Douglas Young is a 48 y.o. male who presents to the emergency department today because of concern for chest pain. Started this afternoon while the patient was sitting down. Located in the center and left chest. Described it as a pressure. It was somewhat intermittent during a total time of 1.5 hours. The pain did radiate to his bilateral shoulders. The patient had some associated diaphoresis. Denies any shortness of breath. Denies any fevers. No history of similar symptoms in the past. Has history of DM, HTN, HLD and family history of heart disease. Denies any pain at the time of my exam.  Records reviewed. Per medical record review patient has a history of DM, HTN.  Past Medical History:  Diagnosis Date  . Diabetes mellitus without complication ([place])   . Hypertension   . Seizures ([place])     There are no problems to display for this patient.   Past Surgical History:  Procedure Laterality Date  . BACK SURGERY    . SKIN GRAFT      Prior to Admission medications   Medication Sig Start Date End Date Taking? Authorizing Provider  metFORMIN (GLUCOPHAGE) 500 MG tablet Take 1 tablet (500 mg total) by mouth 2 (two) times daily with a meal. 10/14/18 11/13/18  Lannie Fields, PA-C    Allergies Prednisone  No family history on file.  Social History Social History   Tobacco Use  . Smoking status: Never Smoker  . Smokeless tobacco: Current User  Substance Use Topics  . Alcohol use: No  . Drug use: Not on file    Review of Systems Constitutional: No fever/chills Eyes: No visual changes. ENT: No sore throat. Cardiovascular: Positive for chest pain. Respiratory: Denies shortness of breath. Gastrointestinal: No abdominal  pain.  No nausea, no vomiting.  No diarrhea.   Genitourinary: Negative for dysuria. Musculoskeletal: Negative for back pain. Skin: Negative for rash. Neurological: Negative for headaches, focal weakness or numbness.  ____________________________________________   PHYSICAL EXAM:  VITAL SIGNS: ED Triage Vitals  Enc Vitals Group     BP 04/04/19 1551 (!) 172/92     Pulse Rate 04/04/19 1551 80     Resp 04/04/19 1551 16     Temp 04/04/19 1551 98 F (36.7 C)     Temp Source 04/04/19 1551 Oral     SpO2 04/04/19 1551 100 %     Weight 04/04/19 1552 208 lb (94.3 kg)     Height 04/04/19 1552 5\' 10"  (1.778 m)     Head Circumference --      Peak Flow --      Pain Score 04/04/19 1551 2   Constitutional: Alert and oriented.  Eyes: Conjunctivae are normal.  ENT      Head: Normocephalic and atraumatic.      Nose: No congestion/rhinnorhea.      Mouth/Throat: Mucous membranes are moist.      Neck: No stridor. Hematological/Lymphatic/Immunilogical: No cervical lymphadenopathy. Cardiovascular: Normal rate, regular rhythm.  No murmurs, rubs, or gallops. Respiratory: Normal respiratory effort without tachypnea nor retractions. Breath sounds are clear and equal bilaterally. No wheezes/rales/rhonchi. Gastrointestinal: Soft and non tender. No rebound. No guarding.  Genitourinary: Deferred Musculoskeletal: Normal range of motion in all extremities. No lower extremity edema. Neurologic:  Normal speech and  language. No gross focal neurologic deficits are appreciated.  Skin:  Skin is warm, dry and intact. No rash noted. Psychiatric: Mood and affect are normal. Speech and behavior are normal. Patient exhibits appropriate insight and judgment.  ____________________________________________    LABS (pertinent positives/negatives)  Trop hs 19 -> 159 CBC wbc 13.4, hgb 16.1, plt 323 BMP na 129, cl 94, glu 293, cr 1.05  ____________________________________________   EKG  I, Phineas Semen,  attending physician, personally viewed and interpreted this EKG  EKG Time: 1547 Rate: 90 Rhythm: normal sinus rhythm Axis: normal Intervals: qtc 415 QRS: narrow, q waves v1 ST changes: no st elevation Impression: abnormal ekg  ____________________________________________    RADIOLOGY  CXR No acute abnormality, cholelithiasis   ____________________________________________   PROCEDURES  Procedures  ____________________________________________   INITIAL IMPRESSION / ASSESSMENT AND PLAN / ED COURSE  Pertinent labs & imaging results that were available during my care of the patient were reviewed by me and considered in my medical decision making (see chart for details).   Patient presented to the emergency department today because of concerns for chest pain.  By the time my exam patient was chest pain-free.  Patient has multiple risk factors for heart disease.  Initial troponin 19.  Repeat 159.  At this point I do have concerns for heart damage.  I discussed this with the patient.  Will plan on admission.  ____________________________________________   FINAL CLINICAL IMPRESSION(S) / ED DIAGNOSES  Final diagnoses:  Chest pain, unspecified type  Elevated troponin     Note: This dictation was prepared with Dragon dictation. Any transcriptional errors that result from this process are unintentional     Phineas Semen, MD 04/04/19 1910

## 2019-04-04 NOTE — ED Triage Notes (Signed)
Patient presents to the ED with centralized chest pain radiating bilaterally to his shoulders x 1.5 hours intermittently.  Patient states it feels like "500lb on my chest".  Patient states at this moment the chest pain is not too bad.  Patient reports feeling hot, denies nausea and diaphoresis.

## 2019-04-04 NOTE — ED Notes (Signed)
Called lab and spoke with Levada Dy to check on troponin sent down over hour ago. Per Levada Dy it was received and they are working on it. MD Archie Balboa informed.

## 2019-04-04 NOTE — ED Notes (Addendum)
Date and time results received: 04/04/19 6:58 PM    Test: troponins Critical Value: 159  Name of Provider Notified: Archie Balboa MD  See new orders.

## 2019-04-04 NOTE — ED Notes (Signed)
Pt given meal tray, ok per MD

## 2019-04-04 NOTE — H&P (Signed)
[place] at [facility]   PATIENT NAME: Douglas Young    MR#:  [id number]  DATE OF BIRTH:  11/19/1970  DATE OF ADMISSION:  04/04/2019  PRIMARY CARE PHYSICIAN: [facility]   REQUESTING/REFERRING PHYSICIAN: Phineas Semen, MD  CHIEF COMPLAINT:   Chief Complaint  Patient presents with  . Chest Pain    HISTORY OF PRESENT ILLNESS:  Douglas Young  is a 48 y.o. Caucasian male with a known history of type 2 diabetes mellitus, hypertension and seizure disorder, presented to the emergency room with a consult of left-sided parasternal chest pain felt as a dull aching pain with radiation to both shoulders.  He graded at 5-6/10 in severity.  He denied any associated nausea or vomiting or diaphoresis.  He denied any dyspnea or palpitations.  No fever or chills.  No leg pain or edema recent travels or surgeries.  No bleeding diathesis.  Upon presentation to the emergency room blood pressure was 172/92 with otherwise normal vital signs.  Labs revealed mild leukocytosis of 13.4.  Troponin was 19 and later 159.  COVID-19 test is currently pending.  Two-view chest x-ray showed cholelithiasis without acute cardiopulmonary disease.  EKG showed normal sinus rhythm with a rate of 90 with low voltage QRS and T wave inversion inferiorly.  The patient was given 4 of aspirin.  He was pain-free during my interview.  He will be admitted to an observation telemetry bed for further evaluation and management. PAST MEDICAL HISTORY:   Past Medical History:  Diagnosis Date  . Diabetes mellitus without complication (HCC)   . Hypertension   . Seizures (HCC)     PAST SURGICAL HISTORY:   Past Surgical History:  Procedure Laterality Date  . BACK SURGERY    . SKIN GRAFT      SOCIAL HISTORY:   Social History   Tobacco Use  . Smoking status: Never Smoker  . Smokeless tobacco: Current User  Substance Use Topics  . Alcohol use: No    FAMILY HISTORY:  Positive for  coronary artery disease in his father who had MI and CABG in his 35s.  History is otherwise positive for COPD and diabetes mellitus.  DRUG ALLERGIES:   Allergies  Allergen Reactions  . Prednisone Hives    REVIEW OF SYSTEMS:   ROS As per history of present illness. All pertinent systems were reviewed above. Constitutional,  HEENT, cardiovascular, respiratory, GI, GU, musculoskeletal, neuro, psychiatric, endocrine,  integumentary and hematologic systems were reviewed and are otherwise  negative/unremarkable except for positive findings mentioned above in the HPI.   MEDICATIONS AT HOME:   Prior to Admission medications   Medication Sig Start Date End Date Taking? Authorizing Provider  hydrochlorothiazide (HYDRODIURIL) 12.5 MG tablet Take 12.5 mg by mouth daily. 03/16/19  Yes [provider]  lisinopril (ZESTRIL) 20 MG tablet Take 20 mg by mouth daily. 02/18/19  Yes [provider]  metFORMIN (GLUCOPHAGE) 1000 MG tablet Take 1,000 mg by mouth 2 (two) times daily. 02/18/19  Yes [provider]  pravastatin (PRAVACHOL) 40 MG tablet Take 40 mg by mouth daily. 03/16/19  Yes [provider]      VITAL SIGNS:  Blood pressure 127/78, pulse 73, temperature 98 F (36.7 C), temperature source Oral, resp. rate 19, height 5\' 10"  (1.778 m), weight 94.3 kg, SpO2 98 %.  PHYSICAL EXAMINATION:  Physical Exam  GENERAL:  48 y.o.-year-old Caucasian male patient lying in the bed with no acute distress.  EYES:  Pupils equal, round, reactive to light and accommodation. No scleral icterus. Extraocular muscles intact.  HEENT: Head atraumatic, normocephalic. Oropharynx and nasopharynx clear.  NECK:  Supple, no jugular venous distention. No thyroid enlargement, no tenderness.  LUNGS: Normal breath sounds bilaterally, no wheezing, rales,rhonchi or crepitation. No use of accessory muscles of respiration.  CARDIOVASCULAR: Regular rate and rhythm, S1, S2 normal. No murmurs, rubs,  or gallops.  ABDOMEN: Soft, nondistended, nontender. Bowel sounds present. No organomegaly or mass.  EXTREMITIES: No pedal edema, cyanosis, or clubbing.  NEUROLOGIC: Cranial nerves II through XII are intact. Muscle strength 5/5 in all extremities. Sensation intact. Gait not checked.  PSYCHIATRIC: The patient is alert and oriented x 3.  Normal affect and good eye contact. SKIN: No obvious rash, lesion, or ulcer.   LABORATORY PANEL:   CBC Recent Labs  Lab 04/04/19 1554  WBC 13.4*  HGB 16.1  HCT 47.3  PLT 323   ------------------------------------------------------------------------------------------------------------------  Chemistries  Recent Labs  Lab 04/04/19 1554  NA 128*  K 4.3  CL 94*  CO2 25  GLUCOSE 293*  BUN 24*  CREATININE 1.05  CALCIUM 9.4   ------------------------------------------------------------------------------------------------------------------  Cardiac Enzymes No results for input(s): TROPONINI in the last 168 hours. ------------------------------------------------------------------------------------------------------------------  RADIOLOGY:  DG Chest 2 View  Result Date: 04/04/2019 CLINICAL DATA:  Intermittent chest pain for the past 1.5 hours. History of hypertension with recent medication change. EXAM: CHEST - 2 VIEW COMPARISON:  01/31/2010. FINDINGS: Normal sized heart. Clear lungs with normal vascularity. Unremarkable bones. Multiple small gallstones layering in the dependent portion of the gallbladder. IMPRESSION: 1. No acute abnormality. 2. Cholelithiasis. Electronically Signed   By: Claudie Revering M.D.   On: 04/04/2019 16:24      IMPRESSION AND PLAN:   1.  Chest pain with elevated troponin I, concerning for ACS/non-ST elevation MI.  The patient will be admitted to an observation telemetry bed.  We will follow serial troponin I.  We will continue aspirin as well as statin therapy and start beta-blocker therapy with p.o. Lopressor.  ACE  inhibitor therapy will be continued.  Will obtain a 2D echo and a cardiology consultation back to normal clinic in a.m.  The patient will be placed on aspirin as well as as needed IV morphine sulfate and sublingual nitroglycerin.  I notified Dr. Ubaldo Glassing about the patient.  We will place him on IV heparin with a bolus and drip for now.  2.  Type 2 diabetes mellitus.  He will be placed on supplemental coverage with NovoLog.  We will hold off Metformin.  3.  Hypertension.  We will continue Zestril.  4.  Dyslipidemia.  We will continue statin therapy and check fasting lipids in a.m.  5.  DVT prophylaxis.  The patient will be placed on IV heparin.  All the records are reviewed and case discussed with ED provider. The plan of care was discussed in details with the patient (and family). I answered all questions. The patient agreed to proceed with the above mentioned plan. Further management will depend upon hospital course.   CODE STATUS: Full code  TOTAL TIME TAKING CARE OF THIS PATIENT: 55 minutes.    Christel Mormon M.D on 04/04/2019 at 8:51 PM  [facility]   From 7 PM-7 AM, contact night-coverage [email or web address]  CC: Primary care physician; [facility]   Note: This dictation was prepared with Dragon dictation along with smaller phrase technology. Any transcriptional errors that result from this process are unintentional.

## 2019-04-04 NOTE — ED Notes (Signed)
Pt ambulated to toilet at this time. 

## 2019-04-05 ENCOUNTER — Encounter: Payer: Self-pay | Admitting: Family Medicine

## 2019-04-05 ENCOUNTER — Other Ambulatory Visit: Payer: Self-pay | Admitting: Cardiology

## 2019-04-05 ENCOUNTER — Observation Stay: Admit: 2019-04-05 | Payer: BC Managed Care – PPO

## 2019-04-05 ENCOUNTER — Other Ambulatory Visit: Payer: Self-pay

## 2019-04-05 ENCOUNTER — Inpatient Hospital Stay (HOSPITAL_COMMUNITY)
Admit: 2019-04-05 | Discharge: 2019-04-05 | Disposition: A | Discharge status: Home / Self Care | Payer: BC Managed Care – PPO | Attending: Family Medicine | Admitting: Family Medicine

## 2019-04-05 ENCOUNTER — Encounter
Admission: EM | Disposition: A | Discharge status: Home / Self Care | Payer: Self-pay | Source: Home / Self Care | Attending: Internal Medicine

## 2019-04-05 DIAGNOSIS — Z7984 Long term (current) use of oral hypoglycemic drugs: Secondary | ICD-10-CM | POA: Diagnosis not present

## 2019-04-05 DIAGNOSIS — I119 Hypertensive heart disease without heart failure: Secondary | ICD-10-CM | POA: Diagnosis present

## 2019-04-05 DIAGNOSIS — Z79899 Other long term (current) drug therapy: Secondary | ICD-10-CM | POA: Diagnosis not present

## 2019-04-05 DIAGNOSIS — E119 Type 2 diabetes mellitus without complications: Secondary | ICD-10-CM | POA: Diagnosis present

## 2019-04-05 DIAGNOSIS — R079 Chest pain, unspecified: Secondary | ICD-10-CM | POA: Diagnosis not present

## 2019-04-05 DIAGNOSIS — Z833 Family history of diabetes mellitus: Secondary | ICD-10-CM | POA: Diagnosis not present

## 2019-04-05 DIAGNOSIS — R778 Other specified abnormalities of plasma proteins: Secondary | ICD-10-CM | POA: Diagnosis present

## 2019-04-05 DIAGNOSIS — D72828 Other elevated white blood cell count: Secondary | ICD-10-CM

## 2019-04-05 DIAGNOSIS — D72829 Elevated white blood cell count, unspecified: Secondary | ICD-10-CM | POA: Diagnosis present

## 2019-04-05 DIAGNOSIS — I10 Essential (primary) hypertension: Secondary | ICD-10-CM | POA: Diagnosis not present

## 2019-04-05 DIAGNOSIS — G40909 Epilepsy, unspecified, not intractable, without status epilepticus: Secondary | ICD-10-CM | POA: Diagnosis present

## 2019-04-05 DIAGNOSIS — E785 Hyperlipidemia, unspecified: Secondary | ICD-10-CM | POA: Diagnosis present

## 2019-04-05 DIAGNOSIS — I255 Ischemic cardiomyopathy: Secondary | ICD-10-CM | POA: Diagnosis present

## 2019-04-05 DIAGNOSIS — I214 Non-ST elevation (NSTEMI) myocardial infarction: Principal | ICD-10-CM

## 2019-04-05 DIAGNOSIS — Z20828 Contact with and (suspected) exposure to other viral communicable diseases: Secondary | ICD-10-CM | POA: Diagnosis present

## 2019-04-05 DIAGNOSIS — Z8249 Family history of ischemic heart disease and other diseases of the circulatory system: Secondary | ICD-10-CM | POA: Diagnosis not present

## 2019-04-05 HISTORY — PX: LEFT HEART CATH AND CORONARY ANGIOGRAPHY: CATH118249

## 2019-04-05 HISTORY — PX: CORONARY STENT INTERVENTION: CATH118234

## 2019-04-05 LAB — LIPID PANEL
Cholesterol: 141 mg/dL (ref 0–200)
HDL: 26 mg/dL — ABNORMAL LOW (ref >40)
LDL Cholesterol: 90 mg/dL (ref 0–99)
Total CHOL/HDL Ratio: 5.4 RATIO
Triglycerides: 123 mg/dL (ref <150)
VLDL: 25 mg/dL (ref 0–40)

## 2019-04-05 LAB — SARS CORONAVIRUS 2 (TAT 6-24 HRS): SARS Coronavirus 2: NEGATIVE

## 2019-04-05 LAB — HEMOGLOBIN A1C
Hgb A1c MFr Bld: 7.8 % — ABNORMAL HIGH (ref 4.8–5.6)
Mean Plasma Glucose: 177.16 mg/dL

## 2019-04-05 LAB — ECHOCARDIOGRAM COMPLETE
Height: 70 in
Weight: 3296 oz

## 2019-04-05 LAB — GLUCOSE, CAPILLARY
Glucose-Capillary: 214 mg/dL — ABNORMAL HIGH (ref 70–99)
Glucose-Capillary: 217 mg/dL — ABNORMAL HIGH (ref 70–99)
Glucose-Capillary: 188 mg/dL — ABNORMAL HIGH (ref 70–99)
Glucose-Capillary: 174 mg/dL — ABNORMAL HIGH (ref 70–99)
Glucose-Capillary: 283 mg/dL — ABNORMAL HIGH (ref 70–99)

## 2019-04-05 LAB — MAGNESIUM: Magnesium: 2 mg/dL (ref 1.7–2.4)

## 2019-04-05 LAB — TROPONIN I (HIGH SENSITIVITY): Troponin I (High Sensitivity): >27000 ng/L (ref <18)

## 2019-04-05 LAB — POCT ACTIVATED CLOTTING TIME
Activated Clotting Time: 323 seconds
Activated Clotting Time: 307 seconds

## 2019-04-05 LAB — PROTIME-INR
INR: 1.2 (ref 0.8–1.2)
Prothrombin Time: 14.9 seconds (ref 11.4–15.2)

## 2019-04-05 LAB — APTT: aPTT: 42 seconds — ABNORMAL HIGH (ref 24–36)

## 2019-04-05 SURGERY — LEFT HEART CATH AND CORONARY ANGIOGRAPHY
Anesthesia: Moderate Sedation

## 2019-04-05 MED ORDER — HEPARIN BOLUS VIA INFUSION
4000.0000 [IU] | Freq: Once | INTRAVENOUS | Status: AC
  Administered 2019-04-05: 06:00:00 4000 [IU] via INTRAVENOUS
  Filled 2019-04-05: qty 4000

## 2019-04-05 MED ORDER — LABETALOL HCL 5 MG/ML IV SOLN: 10.0000 mg | INTRAVENOUS | Status: AC | PRN

## 2019-04-05 MED ORDER — SODIUM CHLORIDE 0.9% FLUSH: 3.0000 mL | Freq: Two times a day (BID) | INTRAVENOUS | Status: AC

## 2019-04-05 MED ORDER — TICAGRELOR 90 MG PO TABS
90.0000 mg | ORAL_TABLET | Freq: Two times a day (BID) | ORAL | Status: DC
  Administered 2019-04-05 – 2019-04-06 (×2): 90 mg via ORAL
  Filled 2019-04-05 (×2): qty 1

## 2019-04-05 MED ORDER — TIROFIBAN HCL IV 12.5 MG/250 ML
INTRAVENOUS | Status: AC | PRN
  Administered 2019-04-05: 0.15 ug/kg/min via INTRAVENOUS

## 2019-04-05 MED ORDER — TIROFIBAN (AGGRASTAT) BOLUS VIA INFUSION
INTRAVENOUS | Status: DC | PRN
  Administered 2019-04-05: 2335 ug via INTRAVENOUS

## 2019-04-05 MED ORDER — ONDANSETRON HCL 4 MG/2ML IJ SOLN: 4.0000 mg | Freq: Four times a day (QID) | INTRAMUSCULAR | Status: DC | PRN

## 2019-04-05 MED ORDER — TIROFIBAN HCL IN NACL 5-0.9 MG/100ML-% IV SOLN
0.1500 ug/kg/min | INTRAVENOUS | Status: DC
  Filled 2019-04-05 (×2): qty 100

## 2019-04-05 MED ORDER — ASPIRIN 81 MG PO CHEW
81.0000 mg | CHEWABLE_TABLET | Freq: Every day | ORAL | Status: DC
  Administered 2019-04-06: 08:00:00 81 mg via ORAL
  Filled 2019-04-05: qty 1

## 2019-04-05 MED ORDER — SODIUM CHLORIDE 0.9 % IV SOLN: 250.0000 mL | INTRAVENOUS | Status: DC | PRN

## 2019-04-05 MED ORDER — TICAGRELOR 90 MG PO TABS
ORAL_TABLET | ORAL | Status: DC | PRN
  Administered 2019-04-05: 180 mg via ORAL

## 2019-04-05 MED ORDER — ASPIRIN 81 MG PO CHEW: 81.0000 mg | CHEWABLE_TABLET | ORAL | Status: DC

## 2019-04-05 MED ORDER — IOHEXOL 300 MG/ML  SOLN
INTRAMUSCULAR | Status: DC | PRN
  Administered 2019-04-05: 85 mL

## 2019-04-05 MED ORDER — SODIUM CHLORIDE 0.9 % WEIGHT BASED INFUSION: 1.0000 mL/kg/h | INTRAVENOUS | Status: DC

## 2019-04-05 MED ORDER — SODIUM CHLORIDE 0.9 % WEIGHT BASED INFUSION
3.0000 mL/kg/h | INTRAVENOUS | Status: DC
  Administered 2019-04-05: 09:00:00 3 mL/kg/h via INTRAVENOUS

## 2019-04-05 MED ORDER — ACETAMINOPHEN 325 MG PO TABS: 650.0000 mg | ORAL_TABLET | ORAL | Status: DC | PRN

## 2019-04-05 MED ORDER — HEPARIN (PORCINE) 25000 UT/250ML-% IV SOLN
1100.0000 [IU]/h | INTRAVENOUS | Status: DC
  Administered 2019-04-05: 06:00:00 1100 [IU]/h via INTRAVENOUS
  Filled 2019-04-05: qty 250

## 2019-04-05 MED ORDER — HEPARIN (PORCINE) IN NACL 2000-0.9 UNIT/L-% IV SOLN
INTRAVENOUS | Status: DC | PRN
  Administered 2019-04-05: 1000 mL

## 2019-04-05 MED ORDER — FENTANYL CITRATE (PF) 100 MCG/2ML IJ SOLN
INTRAMUSCULAR | Status: DC | PRN
  Administered 2019-04-05: 25 ug via INTRAVENOUS

## 2019-04-05 MED ORDER — SODIUM CHLORIDE 0.9 % IV SOLN
INTRAVENOUS | Status: AC | PRN
  Administered 2019-04-05 (×2): 1.75 mg/kg/h via INTRAVENOUS

## 2019-04-05 MED ORDER — FENTANYL CITRATE (PF) 100 MCG/2ML IJ SOLN
INTRAMUSCULAR | Status: DC | PRN
  Administered 2019-04-05: 50 ug via INTRAVENOUS

## 2019-04-05 MED ORDER — BIVALIRUDIN TRIFLUOROACETATE 250 MG IV SOLR
INTRAVENOUS | Status: AC
  Filled 2019-04-05: qty 250

## 2019-04-05 MED ORDER — IOHEXOL 300 MG/ML  SOLN
INTRAMUSCULAR | Status: DC | PRN
  Administered 2019-04-05: 12:00:00 115 mL via INTRA_ARTERIAL

## 2019-04-05 MED ORDER — SODIUM CHLORIDE 0.9% FLUSH: 3.0000 mL | INTRAVENOUS | Status: DC | PRN

## 2019-04-05 MED ORDER — MIDAZOLAM HCL 2 MG/2ML IJ SOLN
INTRAMUSCULAR | Status: AC
  Filled 2019-04-05: qty 2

## 2019-04-05 MED ORDER — TICAGRELOR 90 MG PO TABS
ORAL_TABLET | ORAL | Status: AC
  Filled 2019-04-05: qty 2

## 2019-04-05 MED ORDER — SODIUM CHLORIDE 0.9 % WEIGHT BASED INFUSION
1.0000 mL/kg/h | INTRAVENOUS | Status: AC
  Administered 2019-04-05: 16:00:00 1 mL/kg/h via INTRAVENOUS

## 2019-04-05 MED ORDER — HYDRALAZINE HCL 20 MG/ML IJ SOLN: 10.0000 mg | INTRAMUSCULAR | Status: AC | PRN

## 2019-04-05 MED ORDER — MIDAZOLAM HCL 2 MG/2ML IJ SOLN
INTRAMUSCULAR | Status: DC | PRN
  Administered 2019-04-05: 1 mg via INTRAVENOUS

## 2019-04-05 MED ORDER — FENTANYL CITRATE (PF) 100 MCG/2ML IJ SOLN
INTRAMUSCULAR | Status: AC
  Filled 2019-04-05: qty 2

## 2019-04-05 MED ORDER — ASPIRIN 81 MG PO CHEW: 81.0000 mg | CHEWABLE_TABLET | Freq: Every day | ORAL | Status: DC

## 2019-04-05 MED ORDER — BIVALIRUDIN BOLUS VIA INFUSION - CUPID
INTRAVENOUS | Status: DC | PRN
  Administered 2019-04-05: 70.05 mg via INTRAVENOUS

## 2019-04-05 MED ORDER — SODIUM CHLORIDE 0.9 % IV SOLN
INTRAVENOUS | Status: AC | PRN
  Administered 2019-04-05: 500 mL via INTRAVENOUS

## 2019-04-05 MED ORDER — HEPARIN (PORCINE) IN NACL 1000-0.9 UT/500ML-% IV SOLN
INTRAVENOUS | Status: AC
  Filled 2019-04-05: qty 1000

## 2019-04-05 MED ORDER — SODIUM CHLORIDE 0.9% FLUSH
3.0000 mL | Freq: Two times a day (BID) | INTRAVENOUS | Status: DC
  Administered 2019-04-05 – 2019-04-06 (×2): 3 mL via INTRAVENOUS

## 2019-04-05 MED ORDER — TIROFIBAN HCL IV 12.5 MG/250 ML
INTRAVENOUS | Status: AC
  Filled 2019-04-05: qty 250

## 2019-04-05 SURGICAL SUPPLY — 24 items
BALLN TREK RX 2.25X12 (BALLOONS) ×3
BALLN TREK RX 2.5X20 (BALLOONS) ×3
BALLN ~~LOC~~ TREK RX 3.0X15 (BALLOONS) ×3
BALLOON TREK RX 2.25X12 (BALLOONS) IMPLANT
BALLOON TREK RX 2.5X20 (BALLOONS) IMPLANT
BALLOON ~~LOC~~ TREK RX 3.0X15 (BALLOONS) IMPLANT
CATH INFINITI 5FR ANG PIGTAIL (CATHETERS) ×2 IMPLANT
CATH INFINITI 5FR JL4 (CATHETERS) ×2 IMPLANT
CATH INFINITI JR4 5F (CATHETERS) ×2 IMPLANT
CATH VISTA GUIDE 6FR JL3.5 (CATHETERS) ×2 IMPLANT
CATH VISTA GUIDE 6FR XB3.5 (CATHETERS) ×2 IMPLANT
DEVICE CLOSURE MYNXGRIP 6/7F (Vascular Products) ×2 IMPLANT
DEVICE INFLAT 30 PLUS (MISCELLANEOUS) ×2 IMPLANT
KIT MANI 3VAL PERCEP (MISCELLANEOUS) ×3 IMPLANT
NDL PERC 18GX7CM (NEEDLE) IMPLANT
NEEDLE PERC 18GX7CM (NEEDLE) ×3 IMPLANT
PACK CARDIAC CATH (CUSTOM PROCEDURE TRAY) ×3 IMPLANT
SHEATH AVANTI 5FR X 11CM (SHEATH) ×2 IMPLANT
SHEATH AVANTI 6FR X 11CM (SHEATH) ×2 IMPLANT
STENT RESOLUTE ONYX 2.5X18 (Permanent Stent) ×2 IMPLANT
STENT RESOLUTE ONYX 2.75X15 (Permanent Stent) IMPLANT
STENT RESOLUTE ONYX 2.75X22 (Permanent Stent) ×2 IMPLANT
WIRE ASAHI PROWATER 180CM (WIRE) ×2 IMPLANT
WIRE GUIDERIGHT .035X150 (WIRE) ×2 IMPLANT

## 2019-04-05 NOTE — Progress Notes (Signed)
Pt pulse in right dorsalis pedis changed post procedure, initially +2 . Upon arrival from Cath Lab DP was +1. At 1300 DP could only be found with doppler, strong auscultation. Dr. Saralyn Pilar and Dr. Ubaldo Glassing notified via Epic text message. Per Dr. Ubaldo Glassing no orders at this time , monitor closely for now. Md also notified of Patients severe pressure in ABD, unable to void. I/O cath completed yielding 800 ml per Dr. Ubaldo Glassing order. Pt resting quietly, groin site CDI, mild swelling unchanged from initial assessment. Will monitor Pt closely.

## 2019-04-05 NOTE — Progress Notes (Addendum)
Per Verbal order Dr. Saralyn Pilar, Aggrastat Gtt to run 18 hours total. Started in Cath Lab at 1125.

## 2019-04-05 NOTE — Consult Note (Signed)
CARDIOLOGY CONSULT NOTE               Patient ID: Douglas Young MRN: [id number] DOB/AGE: 47-Mar-1972 47 y.o.  Admit date: 04/04/2019 Referring Physician Dr. Eugenie Norrie  Primary Physician [facility]  Primary Cardiologist n/a Reason for Consultation Chest pain, elevated troponin   HPI: Douglas Young is a 48 year old male with a past medical history significant for type 2 diabetes, seizure disorder, hyperlipidemia, hypertension, and family history of CAD who presented to the ED on 04/04/19 for a 2 hour history of left-sided chest pain, radiating between his shoulder blades, with associated diaphoresis.  Pain resolved without intervention and he remains chest pain free at this time.  He reports he was in normal health prior to the onset of chest pain around 3:30pm yesterday afternoon.  Workup in the ED included high sensitivity troponin of 19, 159, and >27,000 respectively, initial ECG revealing sinus rhythm with repeat ECG revealing inferior Q waves, intraventricular conduction block and new onset atrial fibrillation, rate of 105bpm, and chest xray negative for acute cardiopulmonary disease.  Sodium 128, WBC 13.4, COVID-19 negative. He denies shortness of breath, palpitations, lower extremity swelling, orthopnea, PND, dizziness, lightheadedness, or syncopal/presyncopal episodes.   He has no prior cardiac history to report, but has significant family history of CAD.   Review of systems complete and found to be negative unless listed above     Past Medical History:  Diagnosis Date  . Diabetes mellitus without complication ([place])   . Hypertension   . Seizures ([place])     Past Surgical History:  Procedure Laterality Date  . BACK SURGERY    . SKIN GRAFT      Medications Prior to Admission  Medication Sig Dispense Refill Last Dose  . hydrochlorothiazide (HYDRODIURIL) 12.5 MG tablet Take 12.5 mg by mouth daily.   04/04/2019 at 1330  . lisinopril (ZESTRIL) 20 MG tablet Take 20  mg by mouth daily.   04/04/2019 at 1330  . metFORMIN (GLUCOPHAGE) 1000 MG tablet Take 1,000 mg by mouth 2 (two) times daily.   04/03/2019 at 2000  . pravastatin (PRAVACHOL) 40 MG tablet Take 40 mg by mouth daily.   04/03/2019 at [place] History  . Marital status: Single    Spouse name: Not on file  . Number of children: Not on file  . Years of education: Not on file  . Highest education level: Not on file  Occupational History  . Not on file  Tobacco Use  . Smoking status: Never Smoker  . Smokeless tobacco: Current User  Substance and Sexual Activity  . Alcohol use: No  . Drug use: Not on file  . Sexual activity: Not on file  Other Topics Concern  . Not on file  Social History Narrative  . Not on file   Social Determinants of Health   Financial Resource Strain:   . Difficulty of Paying Living Expenses: Not on file  Food Insecurity:   . Worried About Charity fundraiser in the Last Year: Not on file  . Ran Out of Food in the Last Year: Not on file  Transportation Needs:   . Lack of Transportation (Medical): Not on file  . Lack of Transportation (Non-Medical): Not on file  Physical Activity:   . Days of Exercise per Week: Not on file  . Minutes of Exercise per Session: Not on file  Stress:   . Feeling of Stress :  Not on file  Social Connections:   . Frequency of Communication with Friends and Family: Not on file  . Frequency of Social Gatherings with Friends and Family: Not on file  . Attends Religious Services: Not on file  . Active Member of Clubs or Organizations: Not on file  . Attends BankerClub or Organization Meetings: Not on file  . Marital Status: Not on file  Intimate Partner Violence:   . Fear of Current or Ex-Partner: Not on file  . Emotionally Abused: Not on file  . Physically Abused: Not on file  . Sexually Abused: Not on file    No family history on file.    Review of systems complete and found to be negative unless listed  above     PHYSICAL EXAM  General: Well developed, well nourished, in no acute distress HEENT:  Normocephalic and atramatic Neck:  No JVD.  Lungs: Clear bilaterally to auscultation and percussion. Heart: HRRR . Normal S1 and S2 without gallops or murmurs.  Abdomen: Bowel sounds are positive, abdomen soft and non-tender  Msk:  Back normal. Normal strength and tone for age. Extremities: No clubbing, cyanosis or edema.   Neuro: Alert and oriented X 3. Psych:  Good affect, responds appropriately  Labs:   Lab Results  Component Value Date   WBC 13.4 (H) 04/04/2019   HGB 16.1 04/04/2019   HCT 47.3 04/04/2019   MCV 80.0 04/04/2019   PLT 323 04/04/2019    Recent Labs  Lab 04/04/19 1554  NA 128*  K 4.3  CL 94*  CO2 25  BUN 24*  CREATININE 1.05  CALCIUM 9.4  GLUCOSE 293*   No results found for: CKTOTAL, CKMB, CKMBINDEX, TROPONINI  Lab Results  Component Value Date   CHOL 141 04/05/2019   CHOL  02/02/2010    130        ATP III CLASSIFICATION:  <200     mg/dL   Desirable  [id number]  mg/dL   Borderline High  >=045>=240    mg/dL   High          Lab Results  Component Value Date   HDL 26 (L) 04/05/2019   HDL 28 (L) 02/02/2010   Lab Results  Component Value Date   LDLCALC 90 04/05/2019   LDLCALC  02/02/2010    85        Total Cholesterol/HDL:CHD Risk Coronary Heart Disease Risk Table                     Men   Women  1/2 Average Risk   3.4   3.3  Average Risk       5.0   4.4  2 X Average Risk   9.6   7.1  3 X Average Risk  23.4   11.0        Use the calculated Patient Ratio above and the CHD Risk Table to determine the patient's CHD Risk.        ATP III CLASSIFICATION (LDL):  <100     mg/dL   Optimal  [id number]  mg/dL   Near or Above                    Optimal  130-159  mg/dL   Borderline  [id number]  mg/dL   High  >956>190     mg/dL   Very High   Lab Results  Component Value Date   TRIG 123 04/05/2019   TRIG 85 02/02/2010  Lab Results  Component Value Date    CHOLHDL 5.4 04/05/2019   CHOLHDL 4.6 02/02/2010   No results found for: LDLDIRECT    Radiology: DG Chest 2 View  Result Date: 04/04/2019 CLINICAL DATA:  Intermittent chest pain for the past 1.5 hours. History of hypertension with recent medication change. EXAM: CHEST - 2 VIEW COMPARISON:  01/31/2010. FINDINGS: Normal sized heart. Clear lungs with normal vascularity. Unremarkable bones. Multiple small gallstones layering in the dependent portion of the gallbladder. IMPRESSION: 1. No acute abnormality. 2. Cholelithiasis. Electronically Signed   By: Beckie Salts M.D.   On: 04/04/2019 16:24    EKG: Initial ECG: Normal sinus rhythm            Repeat ECG:  Inferior Q waves, new onset atrial fibrillation at ventricular rate of 105bpm, and new intraventricular conduction block   ASSESSMENT AND PLAN:  1.  Chest pain, elevated troponin   -Consistent with NSTEMI; will proceed with left heart catheterization with potential PCI; benefits and risks of the procedure including bleeding, infection, heart attack, stroke, and death all discussed with the patient who agrees to proceed   -Continue lisinopril; metoprolol added   -Echocardiogram pending   2. Type 2 diabetes  -Hold metformin; will follow renal function and likely resume 48 hours following catheterization   -Continue sliding scale insulin   3.  Hyperlipidemia   -Previously on pravastatin; will transition to atorvastatin 40mg    4.  Hypertension   -Previously on hydrochlorothiazide 12.5mg  and lisinopril 20mg  daily; metoprolol tartrate 25mg  BID added     The history, physical exam findings, and plan of care were all discussed with Dr. , and all decision making was made in collaboration.    Signed: PA-C 04/05/2019, 7:14 AM

## 2019-04-05 NOTE — Progress Notes (Signed)
ANTICOAGULATION CONSULT NOTE - Initial Consult  Pharmacy Consult for Heparin Indication: chest pain/ACS  Allergies  Allergen Reactions  . Prednisone Hives   Patient Measurements: Height: 5\' 10"  (177.8 cm) Weight: 206 lb 14.4 oz (93.8 kg) IBW/kg (Calculated) : 73 HEPARIN DW (KG): 92  Vital Signs: Temp: 97.9 F (36.6 C) (12/22 0323) Temp Source: Oral (12/22 0323) BP: 101/81 (12/22 0323) Pulse Rate: 101 (12/22 0323)  Labs: Recent Labs    04/04/19 1554 04/04/19 1753 04/05/19 0355  HGB 16.1  --   --   HCT 47.3  --   --   PLT 323  --   --   CREATININE 1.05  --   --   TROPONINIHS 19* 159* >27,000*   Estimated Creatinine Clearance: 100 mL/min (by C-G formula based on SCr of 1.05 mg/dL).  Medical History: Past Medical History:  Diagnosis Date  . Diabetes mellitus without complication ([place])   . Hypertension   . Seizures ([place])     Medications:  Medications Prior to Admission  Medication Sig Dispense Refill Last Dose  . hydrochlorothiazide (HYDRODIURIL) 12.5 MG tablet Take 12.5 mg by mouth daily.   04/04/2019 at 1330  . lisinopril (ZESTRIL) 20 MG tablet Take 20 mg by mouth daily.   04/04/2019 at 1330  . metFORMIN (GLUCOPHAGE) 1000 MG tablet Take 1,000 mg by mouth 2 (two) times daily.   04/03/2019 at 2000  . pravastatin (PRAVACHOL) 40 MG tablet Take 40 mg by mouth daily.   04/03/2019 at 2000   Assessment: Pharmacy asked to initiate and manage Heparin for ACS.  No anticoagulants PTA per med list.  Baseline labs ordered.  Goal of Therapy:  Heparin level 0.3-0.7 units/ml Monitor platelets by anticoagulation protocol: Yes   Plan:  Heparin 4000 unit bolus x 1 then infusion at 1100 units/hr Check HL in 6 hours  Hart Robinsons A 04/05/2019,6:09 AM

## 2019-04-05 NOTE — Progress Notes (Signed)
Patient QRS has widen since his last EKG yesterday. Patient is asymptomatic, doctor paged. Will continue to monitor.

## 2019-04-05 NOTE — Progress Notes (Signed)
PROGRESS NOTE    Douglas Young  YTK:[id number] DOB: [birth year] DOA: 04/04/2019 PCP: [facility]       Assessment & Plan:   Active Problems:   Chest pain   NSTEMI (non-ST elevated myocardial infarction) [facility])    NSTEMI: s/p cardiac cath w/ stents placed. Elevated troponins. Continue on aspirin, brilinta, metoprolol, & lisinopril. Echo shows EF 30-35% w/ wall motion abnormalities, grade I diastolic dysfunction. Morphine, nitroglycerin prn for pain. Zofran prn nausea/vomiting. Continue on tele. Cardio following and recs apprec   DM2: will hold home dose of metformin. Will on SSI w/ accuchecks  Hypertension: continue on lisinopril, metoprolol, & HCTZ  Dyslipidemia: continue on statin   Leukocytosis: likely reactive. Will continue to monitor   Obesity: BMI 29.6. Would benefit from weight loss       DVT prophylaxis: bivalirudin  Code Status: full Family Communication:  Disposition Plan:    Consultants:   cardio  Procedures: cardiac cath   Antimicrobials: n/a   Subjective: Pt c/o feeling uncomfortable while having to lay flat for several hours.   Objective: Vitals:   04/05/19 1400 04/05/19 1415 04/05/19 1430 04/05/19 1530  BP: 106/73  121/81 111/75  Pulse: 80  81 84  Resp: 18 (!) 22 (!) 21 (!) 21  Temp:      TempSrc:      SpO2: 99%  96% 97%  Weight:      Height:        Intake/Output Summary (Last 24 hours) at 04/05/2019 1539 Last data filed at 04/05/2019 1415 Gross per 24 hour  Intake 444.15 ml  Output 1350 ml  Net -905.85 ml   Filed Weights   04/04/19 1552 04/05/19 0015 04/05/19 0845  Weight: 94.3 kg 93.8 kg 93.4 kg    Examination:  General exam: Appears calm and comfortable  Respiratory system: diminished breath sounds b/l otherwise clear. Respiratory effort normal. Cardiovascular system: S1 & S2 +. No rubs, gallops or clicks.  Gastrointestinal system: Abdomen is obese, soft and nontender. Normal bowel  sounds heard. Central nervous system: Alert and oriented. Moves all 4 extremities  Psychiatry: Judgement and insight appear normal. Mood & affect appropriate.     Data Reviewed: I have personally reviewed following labs and imaging studies  CBC: Recent Labs  Lab 04/04/19 1554  WBC 13.4*  HGB 16.1  HCT 47.3  MCV 80.0  PLT 323   Basic Metabolic Panel: Recent Labs  Lab 04/04/19 1554 04/05/19 0355  NA 128*  --   K 4.3  --   CL 94*  --   CO2 25  --   GLUCOSE 293*  --   BUN 24*  --   CREATININE 1.05  --   CALCIUM 9.4  --   MG  --  2.0   GFR: Estimated Creatinine Clearance: 99.9 mL/min (by C-G formula based on SCr of 1.05 mg/dL). Liver Function Tests: No results for input(s): AST, ALT, ALKPHOS, BILITOT, PROT, ALBUMIN in the last 168 hours. No results for input(s): LIPASE, AMYLASE in the last 168 hours. No results for input(s): AMMONIA in the last 168 hours. Coagulation Profile: No results for input(s): INR, PROTIME in the last 168 hours. Cardiac Enzymes: No results for input(s): CKTOTAL, CKMB, CKMBINDEX, TROPONINI in the last 168 hours. BNP (last 3 results) No results for input(s): PROBNP in the last 8760 hours. HbA1C: Recent Labs    04/05/19 0356  HGBA1C 7.8*   CBG: Recent Labs  Lab 04/05/19 0717 04/05/19 0925 04/05/19  1448  GLUCAP 214* 217* 188*   Lipid Profile: Recent Labs    04/05/19 0355  CHOL 141  HDL 26*  LDLCALC 90  TRIG 161  CHOLHDL 5.4   Thyroid Function Tests: No results for input(s): TSH, T4TOTAL, FREET4, T3FREE, THYROIDAB in the last 72 hours. Anemia Panel: No results for input(s): VITAMINB12, FOLATE, FERRITIN, TIBC, IRON, RETICCTPCT in the last 72 hours. Sepsis Labs: No results for input(s): PROCALCITON, LATICACIDVEN in the last 168 hours.  Recent Results (from the past 240 hour(s))  SARS CORONAVIRUS 2 (TAT 6-24 HRS) Nasopharyngeal Nasopharyngeal Swab     Status: None   Collection Time: 04/04/19  7:00 PM   Specimen: Nasopharyngeal  Swab  Result Value Ref Range Status   SARS Coronavirus 2 NEGATIVE NEGATIVE Final    Comment: (NOTE) SARS-CoV-2 target nucleic acids are NOT DETECTED. The SARS-CoV-2 RNA is generally detectable in upper and lower respiratory specimens during the acute phase of infection. Negative results do not preclude SARS-CoV-2 infection, do not rule out co-infections with other pathogens, and should not be used as the sole basis for treatment or other patient management decisions. Negative results must be combined with clinical observations, patient history, and epidemiological information. The expected result is Negative. Fact Sheet for Patients: HairSlick.no Fact Sheet for Healthcare Providers: quierodirigir.com This test is not yet approved or cleared by the [place] FDA and  has been authorized for detection and/or diagnosis of SARS-CoV-2 by FDA under an Emergency Use Authorization (EUA). This EUA will remain  in effect (meaning this test can be used) for the duration of the COVID-19 declaration under Section 56 4(b)(1) of the Act, 21 U.S.C. section 360bbb-3(b)(1), unless the authorization is terminated or revoked sooner. Performed at [facility], [address], [place], [place] [address]          Radiology Studies: DG Chest 2 View  Result Date: 04/04/2019 CLINICAL DATA:  Intermittent chest pain for the past 1.5 hours. History of hypertension with recent medication change. EXAM: CHEST - 2 VIEW COMPARISON:  01/31/2010. FINDINGS: Normal sized heart. Clear lungs with normal vascularity. Unremarkable bones. Multiple small gallstones layering in the dependent portion of the gallbladder. IMPRESSION: 1. No acute abnormality. 2. Cholelithiasis. Electronically Signed   By: Beckie Salts M.D.   On: 04/04/2019 16:24   CARDIAC CATHETERIZATION  Result Date: 04/05/2019  Prox LAD to Mid LAD lesion is 99% stenosed.  There is moderate  left ventricular systolic dysfunction.  LV end diastolic pressure is normal.  The left ventricular ejection fraction is 35-45% by visual estimate.  There is trivial (1+) mitral regurgitation.  Mid Cx lesion is 50% stenosed.  Occluded proximal lad with timi1-2 antegrade flow. Antero apical akinesis with ef of 40-45% Referred to Dr. Darrold Junker for consideration for pci of lad.   CARDIAC CATHETERIZATION  Result Date: 04/05/2019  Prox LAD lesion is 80% stenosed.  Mid LAD lesion is 80% stenosed.  A drug-eluting stent was successfully placed using a STENT RESOLUTE ONYX 2.5X18.  Post intervention, there is a 0% residual stenosis.  A drug-eluting stent was successfully placed using a STENT RESOLUTE ONYX [id number].  Post intervention, there is a 0% residual stenosis.  1.  Non-ST elevation myocardial infarction 2.  High-grade 80% stenosis proximal LAD, 80% stenosis mid LAD with TIMI II flow 3.  Successful PCI with overlapping 2.5 x 18 mm and 2.75 x 22 mm Resolute Onyx stents in proximal and mid LAD with mild no reflow phenomena treated with Aggrastat bolus and  infusion Recommendations 1.  Dual antiplatelet therapy uninterrupted for 1 year 2.  Aggrastat infusion x18 hours   ECHOCARDIOGRAM COMPLETE  Result Date: 04/05/2019   ECHOCARDIOGRAM REPORT   Patient Name:   RAHIL PASSEY [facility] Date of Exam: 04/05/2019 Medical Rec #:  [id number]     Height:       70.0 in Accession #:    [id number]    Weight:       206.0 lb Date of Birth:  1971/01/24     BSA:          2.11 m Patient Age:    48 years      BP:           109/72 mmHg Patient Gender: M             HR:           66 bpm. Exam Location:  [facility] Procedure: 2D Echo, Cardiac Doppler and Color Doppler Indications:     Elevated troponin  History:         Patient has no prior history of Echocardiogram examinations.                  Risk Factors:Hypertension and Diabetes.  Sonographer:     Cristela Blue RDCS (AE) Referring Phys:  [id number] Vernetta Honey MANSY Diagnosing Phys: Lorine Bears  MD IMPRESSIONS  1. Left ventricular ejection fraction, by visual estimation, is 30 to 35%. The left ventricle has moderate to severely decreased function. There is moderately increased left ventricular hypertrophy.  2. Severe hypokinesis of the left ventricular, mid-apical anteroseptal wall, anterior wall, anterolateral wall and apical segment.  3. Left ventricular diastolic parameters are consistent with Grade I diastolic dysfunction (impaired relaxation).  4. The left ventricle demonstrates regional wall motion abnormalities.  5. Global right ventricle has normal systolic function.The right ventricular size is normal. No increase in right ventricular wall thickness.  6. Left atrial size was normal.  7. Right atrial size was normal.  8. The mitral valve is normal in structure. No evidence of mitral valve regurgitation. No evidence of mitral stenosis.  9. The tricuspid valve is normal in structure. Tricuspid valve regurgitation is not demonstrated. 10. The aortic valve is normal in structure. Aortic valve regurgitation is not visualized. No evidence of aortic valve sclerosis or stenosis. 11. The pulmonic valve was normal in structure. Pulmonic valve regurgitation is not visualized. 12. TR signal is inadequate for assessing pulmonary artery systolic pressure. FINDINGS  Left Ventricle: Left ventricular ejection fraction, by visual estimation, is 30 to 35%. The left ventricle has moderate to severely decreased function. Severe hypokinesis of the left ventricular, mid-apical anteroseptal wall, anterior wall, anterolateral wall and apical segment. The left ventricle demonstrates regional wall motion abnormalities. There is moderately increased left ventricular hypertrophy. Left ventricular diastolic parameters are consistent with Grade I diastolic dysfunction  (impaired relaxation). Normal left atrial pressure. Right Ventricle: The right ventricular size is normal. No increase in right ventricular wall thickness. Global  RV systolic function is has normal systolic function. The tricuspid regurgitant velocity is 1.59 m/s, and with an assumed right atrial pressure  of 10 mmHg, the estimated right ventricular systolic pressure is TR signal is inadequate for assessing PA pressure at 20.1 mmHg. Left Atrium: Left atrial size was normal in size. Right Atrium: Right atrial size was normal in size Pericardium: There is no evidence of pericardial effusion. Mitral Valve: The mitral valve is normal in structure. No evidence of mitral valve regurgitation. No evidence  of mitral valve stenosis by observation. Tricuspid Valve: The tricuspid valve is normal in structure. Tricuspid valve regurgitation is not demonstrated. Aortic Valve: The aortic valve is normal in structure. Aortic valve regurgitation is not visualized. The aortic valve is structurally normal, with no evidence of sclerosis or stenosis. Aortic valve mean gradient measures 1.5 mmHg. Aortic valve peak gradient measures 2.9 mmHg. Aortic valve area, by VTI measures 3.92 cm. Pulmonic Valve: The pulmonic valve was normal in structure. Pulmonic valve regurgitation is not visualized. Pulmonic regurgitation is not visualized. Aorta: The aortic root, ascending aorta and aortic arch are all structurally normal, with no evidence of dilitation or obstruction. Venous: The inferior vena cava was not well visualized. IAS/Shunts: No atrial level shunt detected by color flow Doppler. There is no evidence of a patent foramen ovale. No ventricular septal defect is seen or detected. There is no evidence of an atrial septal defect.  LEFT VENTRICLE PLAX 2D LVIDd:         3.43 cm  Diastology LVIDs:         2.53 cm  LV e' lateral:   8.59 cm/s LV PW:         1.24 cm  LV E/e' lateral: 5.8 LV IVS:        1.54 cm  LV e' medial:    3.81 cm/s LVOT diam:     2.10 cm  LV E/e' medial:  13.1 LV SV:         25 ml LV SV Index:   11.73 LVOT Area:     3.46 cm  RIGHT VENTRICLE RV Basal diam:  2.70 cm RV S prime:      12.30 cm/s TAPSE (M-mode): 3.0 cm LEFT ATRIUM             Index       RIGHT ATRIUM          Index LA diam:        3.90 cm 1.85 cm/m  RA Area:     7.38 cm LA Vol (A2C):   32.8 ml 15.52 ml/m RA Volume:   11.70 ml 5.54 ml/m LA Vol (A4C):   30.8 ml 14.57 ml/m LA Biplane Vol: 32.8 ml 15.52 ml/m  AORTIC VALVE                   PULMONIC VALVE AV Area (Vmax):    2.69 cm    PV Vmax:       0.71 m/s AV Area (Vmean):   3.18 cm    PV Peak grad:  2.0 mmHg AV Area (VTI):     3.92 cm AV Vmax:           84.65 cm/s AV Vmean:          58.350 cm/s AV VTI:            0.118 m AV Peak Grad:      2.9 mmHg AV Mean Grad:      1.5 mmHg LVOT Vmax:         65.80 cm/s LVOT Vmean:        53.500 cm/s LVOT VTI:          0.133 m LVOT/AV VTI ratio: 1.13  AORTA Ao Root diam: 3.10 cm MITRAL VALVE                        TRICUSPID VALVE MV Area (PHT): 3.72 cm  TR Peak grad:   10.1 mmHg MV PHT:        59.16 msec           TR Vmax:        159.00 cm/s MV Decel Time: 204 msec MV E velocity: 50.00 cm/s 103 cm/s  SHUNTS MV A velocity: 67.90 cm/s 70.3 cm/s Systemic VTI:  0.13 m MV E/A ratio:  0.74       1.5       Systemic Diam: 2.10 cm  Lorine BearsMuhammad Arida MD Electronically signed by Lorine BearsMuhammad Arida MD Signature Date/Time: 04/05/2019/2:46:37 PM    Final         Scheduled Meds: . [MAR Hold] alum & mag hydroxide-simeth  30 mL Oral Once   And  . [MAR Hold] lidocaine  15 mL Oral Once  . [START ON 04/06/2019] aspirin  81 mg Oral Daily  . [MAR Hold] atorvastatin  40 mg Oral q1800  . [MAR Hold] hydrochlorothiazide  12.5 mg Oral Daily  . [MAR Hold] insulin aspart  0-9 Units Subcutaneous TID WC  . [MAR Hold] lisinopril  20 mg Oral Daily  . [MAR Hold] metoprolol tartrate  25 mg Oral BID  . sodium chloride flush  3 mL Intravenous Q12H  . ticagrelor  90 mg Oral BID   Continuous Infusions: . sodium chloride 75 mL/hr at 04/05/19 0400  . sodium chloride    . sodium chloride    . [START ON 04/06/2019] sodium chloride 3 mL/kg/hr (04/05/19  0917)   Followed by  . [START ON 04/06/2019] sodium chloride    . sodium chloride       LOS: 0 days    Time spent: 30 mins    Charise KillianJamiese M Hiawatha Dressel, MD [facility] Pager [phone]  If 7PM-7AM, please contact night-coverage [email or web address] Password [email or web address] 04/05/2019, 3:39 PM

## 2019-04-05 NOTE — Progress Notes (Signed)
*  PRELIMINARY RESULTS* Echocardiogram 2D Echocardiogram has been performed.  Douglas Young 04/05/2019, 1:46 PM

## 2019-04-05 NOTE — Progress Notes (Addendum)
Lab called with a critical Troponin of greater than 27,000. Doctor paged, will continue to monitor.

## 2019-04-06 ENCOUNTER — Encounter: Payer: Self-pay | Admitting: Cardiology

## 2019-04-06 DIAGNOSIS — D72829 Elevated white blood cell count, unspecified: Secondary | ICD-10-CM

## 2019-04-06 LAB — BASIC METABOLIC PANEL
Anion gap: 11 (ref 5–15)
BUN: 16 mg/dL (ref 6–20)
CO2: 24 mmol/L (ref 22–32)
Calcium: 8.7 mg/dL — ABNORMAL LOW (ref 8.9–10.3)
Chloride: 99 mmol/L (ref 98–111)
Creatinine, Ser: 1.04 mg/dL (ref 0.61–1.24)
GFR calc Af Amer: >60 mL/min (ref >60)
GFR calc non Af Amer: >60 mL/min (ref >60)
Glucose, Bld: 278 mg/dL — ABNORMAL HIGH (ref 70–99)
Potassium: 4.3 mmol/L (ref 3.5–5.1)
Sodium: 134 mmol/L — ABNORMAL LOW (ref 135–145)

## 2019-04-06 LAB — GLUCOSE, CAPILLARY
Glucose-Capillary: 262 mg/dL — ABNORMAL HIGH (ref 70–99)
Glucose-Capillary: 199 mg/dL — ABNORMAL HIGH (ref 70–99)

## 2019-04-06 LAB — CBC
HCT: 42.4 % (ref 39.0–52.0)
Hemoglobin: 14.3 g/dL (ref 13.0–17.0)
MCH: 27.3 pg (ref 26.0–34.0)
MCHC: 33.7 g/dL (ref 30.0–36.0)
MCV: 80.9 fL (ref 80.0–100.0)
Platelets: 295 10*3/uL (ref 150–400)
RBC: 5.24 MIL/uL (ref 4.22–5.81)
RDW: 12 % (ref 11.5–15.5)
WBC: 12.6 10*3/uL — ABNORMAL HIGH (ref 4.0–10.5)
nRBC: 0 % (ref 0.0–0.2)

## 2019-04-06 LAB — TROPONIN I (HIGH SENSITIVITY): Troponin I (High Sensitivity): 21691 ng/L (ref <18)

## 2019-04-06 MED ORDER — METOPROLOL SUCCINATE ER 25 MG PO TB24: 25.0000 mg | ORAL_TABLET | Freq: Every day | ORAL | 0 refills | Status: AC

## 2019-04-06 MED ORDER — NITROGLYCERIN 0.4 MG/SPRAY TL SOLN: 1.0000 | 12 refills | Status: AC | PRN

## 2019-04-06 MED ORDER — TIROFIBAN HCL IN NACL 5-0.9 MG/100ML-% IV SOLN
0.1500 ug/kg/min | INTRAVENOUS | Status: AC
  Administered 2019-04-06 (×2): 0.15 ug/kg/min via INTRAVENOUS
  Filled 2019-04-06: qty 100

## 2019-04-06 MED ORDER — METOPROLOL SUCCINATE ER 25 MG PO TB24
25.0000 mg | ORAL_TABLET | Freq: Every day | ORAL | Status: DC
  Administered 2019-04-06: 08:00:00 25 mg via ORAL
  Filled 2019-04-06: qty 1

## 2019-04-06 MED ORDER — ASPIRIN 81 MG PO CHEW: 81.0000 mg | CHEWABLE_TABLET | Freq: Every day | ORAL | Status: AC

## 2019-04-06 MED ORDER — TICAGRELOR 90 MG PO TABS: 90.0000 mg | ORAL_TABLET | Freq: Two times a day (BID) | ORAL | 0 refills | Status: AC

## 2019-04-06 NOTE — Progress Notes (Signed)
Went over discharge instructions with the patient including medications and follow-up appointment. Discontinue PIV and Erica RN discontinue telemetry monitor. Help patient for transport.

## 2019-04-06 NOTE — Discharge Summary (Signed)
Douglas Young YQM:[id number] DOB: Jan 12, 1971 DOA: 04/04/2019  [facility]  Admit date: 04/04/2019 Discharge date: 04/06/2019  Admitted From: Home Disposition:  Home  Recommendations for Outpatient Follow-up:  1. Follow up with PCP in 1 week 2. Please obtain BMP/CBC in one week 3. Please follow up on the following pending results:none  4.   Follow up with Dr. Lady GaryFath or Marga HootsNicole Young within 7-10 days  5. F/u with cardiac rehab and CHF clinic  Home Health:none    Discharge Condition:Stable CODE STATUS:Full  Diet recommendation: Heart Healthy  Brief/Interim Summary: Douglas Young  is a 48 y.o. Caucasian male with a known history of type 2 diabetes mellitus, hypertension and seizure disorder, presented to the emergency room with a consult of left-sided parasternal chest pain felt as a dull aching pain with radiation to both shoulders. Found with elevated troponin.  COVID-19 test negative negative .EKG showed normal sinus rhythm with a rate of 90 with low voltage QRS and T wave inversion inferiorly with new left bundle branch block.. Cardiology was consulted.He underwent left heart catheterization revealing a proximal LAD lesion, 80% stenosed and a mid LAD lesion 80% stenosed.  Successful PCI with two overlapping stents (2.5x 18mm and 2.75 x 22mm Resolute Onyx) in the proximal and mid LAD was performed.  Since cardiac cath he has been doing well denies any chest pain or shortness of breath.  He was treated with non-STEMI and started with aspirin 81 daily with Brilinta 90 mg twice daily for 1 year uninterrupted .  He had an echocardiogram revealing EF of 30 to 35% likely ischemic cardiomyopathy.  This a.m. he has no complaints.  Cardiology saw patient and feel he is stable from cardiac standpoint to follow-up as outpatient and to follow-up with cardiac rehab.  Discharge Diagnoses:  Active Problems:   Chest pain   NSTEMI (non-ST elevated myocardial infarction)  (HCC)   Morbid obesity (HCC)   Leucocytosis   DM2 (diabetes mellitus, type 2) (HCC)   HTN (hypertension)    Discharge Instructions  Discharge Instructions    AMB Referral to Cardiac Rehabilitation - Phase II   Complete by: As directed    Diagnosis:  NSTEMI Coronary Stents     After initial evaluation and assessments completed: Virtual Based Care may be provided alone or in conjunction with Phase 2 Cardiac Rehab based on patient barriers.: Yes   AMB referral to CHF clinic   Complete by: As directed    Call MD for:   Complete by: As directed    Chest pain or sob   Diet - low sodium heart healthy   Complete by: As directed    Increase activity slowly   Complete by: As directed      Allergies as of 04/06/2019      Reactions   Prednisone Hives      Medication List    TAKE these medications   aspirin 81 MG chewable tablet Chew 1 tablet (81 mg total) by mouth daily. Start taking on: April 07, 2019   hydrochlorothiazide 12.5 MG tablet Commonly known as: HYDRODIURIL Take 12.5 mg by mouth daily.   lisinopril 20 MG tablet Commonly known as: ZESTRIL Take 20 mg by mouth daily.   metFORMIN 1000 MG tablet Commonly known as: GLUCOPHAGE Take 1,000 mg by mouth 2 (two) times daily.   metoprolol succinate 25 MG 24 hr tablet Commonly known as: TOPROL-XL Take 1 tablet (25 mg total) by mouth daily. Start taking on: April 07, 2019   nitroGLYCERIN 0.4 MG/SPRAY spray Commonly known as: NITROLINGUAL Place 1 spray under the tongue every 5 (five) minutes x 3 doses as needed for chest pain.   pravastatin 40 MG tablet Commonly known as: PRAVACHOL Take 40 mg by mouth daily.   ticagrelor 90 MG Tabs tablet Commonly known as: BRILINTA Take 1 tablet (90 mg total) by mouth 2 (two) times daily.      Follow-up Information    [facility].   Specialty: Cardiac Rehabilitation Why: Your Cardiologist has referred you to outpatient Cardiac Rehab at [facility].  The  Cardiac Rehab department will contact you by phone within one to two weeks after discharge to schedule your first appointment.   Contact information: [address] [id number] [place] [address] [phone]       Dalia Heading, MD. Go on 04/14/2019.   Specialty: Cardiology Why: Or with Marga Hoots, PA-C....Marland KitchenMarland KitchenAppointment at 2:30pm Contact information: [address] [place] [address] [phone]          Allergies  Allergen Reactions  . Prednisone Hives    Consultations:  Cardiology   Procedures/Studies: DG Chest 2 View  Result Date: 04/04/2019 CLINICAL DATA:  Intermittent chest pain for the past 1.5 hours. History of hypertension with recent medication change. EXAM: CHEST - 2 VIEW COMPARISON:  01/31/2010. FINDINGS: Normal sized heart. Clear lungs with normal vascularity. Unremarkable bones. Multiple small gallstones layering in the dependent portion of the gallbladder. IMPRESSION: 1. No acute abnormality. 2. Cholelithiasis. Electronically Signed   By: Beckie Salts M.D.   On: 04/04/2019 16:24   CARDIAC CATHETERIZATION  Result Date: 04/05/2019  Prox LAD to Mid LAD lesion is 99% stenosed.  There is moderate left ventricular systolic dysfunction.  LV end diastolic pressure is normal.  The left ventricular ejection fraction is 35-45% by visual estimate.  There is trivial (1+) mitral regurgitation.  Mid Cx lesion is 50% stenosed.  Occluded proximal lad with timi1-2 antegrade flow. Antero apical akinesis with ef of 40-45% Referred to Dr. Darrold Junker for consideration for pci of lad.   CARDIAC CATHETERIZATION  Result Date: 04/05/2019  Prox LAD lesion is 80% stenosed.  Mid LAD lesion is 80% stenosed.  A drug-eluting stent was successfully placed using a STENT RESOLUTE ONYX 2.5X18.  Post intervention, there is a 0% residual stenosis.  A drug-eluting stent was successfully placed using a STENT RESOLUTE ONYX [id number].  Post  intervention, there is a 0% residual stenosis.  1.  Non-ST elevation myocardial infarction 2.  High-grade 80% stenosis proximal LAD, 80% stenosis mid LAD with TIMI II flow 3.  Successful PCI with overlapping 2.5 x 18 mm and 2.75 x 22 mm Resolute Onyx stents in proximal and mid LAD with mild no reflow phenomena treated with Aggrastat bolus and infusion Recommendations 1.  Dual antiplatelet therapy uninterrupted for 1 year 2.  Aggrastat infusion x18 hours   ECHOCARDIOGRAM COMPLETE  Result Date: 04/05/2019   ECHOCARDIOGRAM REPORT   Patient Name:   Douglas Young [facility] Date of Exam: 04/05/2019 Medical Rec #:  [id number]     Height:       70.0 in Accession #:    [id number]    Weight:       206.0 lb Date of Birth:  [birth year]     BSA:          2.11 m Patient Age:    48 years      BP:  109/72 mmHg Patient Gender: M             HR:           66 bpm. Exam Location:  [facility] Procedure: 2D Echo, Cardiac Doppler and Color Doppler Indications:     Elevated troponin  History:         Patient has no prior history of Echocardiogram examinations.                  Risk Factors:Hypertension and Diabetes.  Sonographer:     Cristela Blue RDCS (AE) Referring Phys:  [id number] Vernetta Honey MANSY Diagnosing Phys: Lorine Bears MD IMPRESSIONS  1. Left ventricular ejection fraction, by visual estimation, is 30 to 35%. The left ventricle has moderate to severely decreased function. There is moderately increased left ventricular hypertrophy.  2. Severe hypokinesis of the left ventricular, mid-apical anteroseptal wall, anterior wall, anterolateral wall and apical segment.  3. Left ventricular diastolic parameters are consistent with Grade I diastolic dysfunction (impaired relaxation).  4. The left ventricle demonstrates regional wall motion abnormalities.  5. Global right ventricle has normal systolic function.The right ventricular size is normal. No increase in right ventricular wall thickness.  6. Left atrial size was normal.  7. Right atrial size was  normal.  8. The mitral valve is normal in structure. No evidence of mitral valve regurgitation. No evidence of mitral stenosis.  9. The tricuspid valve is normal in structure. Tricuspid valve regurgitation is not demonstrated. 10. The aortic valve is normal in structure. Aortic valve regurgitation is not visualized. No evidence of aortic valve sclerosis or stenosis. 11. The pulmonic valve was normal in structure. Pulmonic valve regurgitation is not visualized. 12. TR signal is inadequate for assessing pulmonary artery systolic pressure. FINDINGS  Left Ventricle: Left ventricular ejection fraction, by visual estimation, is 30 to 35%. The left ventricle has moderate to severely decreased function. Severe hypokinesis of the left ventricular, mid-apical anteroseptal wall, anterior wall, anterolateral wall and apical segment. The left ventricle demonstrates regional wall motion abnormalities. There is moderately increased left ventricular hypertrophy. Left ventricular diastolic parameters are consistent with Grade I diastolic dysfunction  (impaired relaxation). Normal left atrial pressure. Right Ventricle: The right ventricular size is normal. No increase in right ventricular wall thickness. Global RV systolic function is has normal systolic function. The tricuspid regurgitant velocity is 1.59 m/s, and with an assumed right atrial pressure  of 10 mmHg, the estimated right ventricular systolic pressure is TR signal is inadequate for assessing PA pressure at 20.1 mmHg. Left Atrium: Left atrial size was normal in size. Right Atrium: Right atrial size was normal in size Pericardium: There is no evidence of pericardial effusion. Mitral Valve: The mitral valve is normal in structure. No evidence of mitral valve regurgitation. No evidence of mitral valve stenosis by observation. Tricuspid Valve: The tricuspid valve is normal in structure. Tricuspid valve regurgitation is not demonstrated. Aortic Valve: The aortic valve is  normal in structure. Aortic valve regurgitation is not visualized. The aortic valve is structurally normal, with no evidence of sclerosis or stenosis. Aortic valve mean gradient measures 1.5 mmHg. Aortic valve peak gradient measures 2.9 mmHg. Aortic valve area, by VTI measures 3.92 cm. Pulmonic Valve: The pulmonic valve was normal in structure. Pulmonic valve regurgitation is not visualized. Pulmonic regurgitation is not visualized. Aorta: The aortic root, ascending aorta and aortic arch are all structurally normal, with no evidence of dilitation or obstruction. Venous: The inferior vena cava was not well visualized. IAS/Shunts: No  atrial level shunt detected by color flow Doppler. There is no evidence of a patent foramen ovale. No ventricular septal defect is seen or detected. There is no evidence of an atrial septal defect.  LEFT VENTRICLE PLAX 2D LVIDd:         3.43 cm  Diastology LVIDs:         2.53 cm  LV e' lateral:   8.59 cm/s LV PW:         1.24 cm  LV E/e' lateral: 5.8 LV IVS:        1.54 cm  LV e' medial:    3.81 cm/s LVOT diam:     2.10 cm  LV E/e' medial:  13.1 LV SV:         25 ml LV SV Index:   11.73 LVOT Area:     3.46 cm  RIGHT VENTRICLE RV Basal diam:  2.70 cm RV S prime:     12.30 cm/s TAPSE (M-mode): 3.0 cm LEFT ATRIUM             Index       RIGHT ATRIUM          Index LA diam:        3.90 cm 1.85 cm/m  RA Area:     7.38 cm LA Vol (A2C):   32.8 ml 15.52 ml/m RA Volume:   11.70 ml 5.54 ml/m LA Vol (A4C):   30.8 ml 14.57 ml/m LA Biplane Vol: 32.8 ml 15.52 ml/m  AORTIC VALVE                   PULMONIC VALVE AV Area (Vmax):    2.69 cm    PV Vmax:       0.71 m/s AV Area (Vmean):   3.18 cm    PV Peak grad:  2.0 mmHg AV Area (VTI):     3.92 cm AV Vmax:           84.65 cm/s AV Vmean:          58.350 cm/s AV VTI:            0.118 m AV Peak Grad:      2.9 mmHg AV Mean Grad:      1.5 mmHg LVOT Vmax:         65.80 cm/s LVOT Vmean:        53.500 cm/s LVOT VTI:          0.133 m LVOT/AV VTI ratio:  1.13  AORTA Ao Root diam: 3.10 cm MITRAL VALVE                        TRICUSPID VALVE MV Area (PHT): 3.72 cm             TR Peak grad:   10.1 mmHg MV PHT:        59.16 msec           TR Vmax:        159.00 cm/s MV Decel Time: 204 msec MV E velocity: 50.00 cm/s 103 cm/s  SHUNTS MV A velocity: 67.90 cm/s 70.3 cm/s Systemic VTI:  0.13 m MV E/A ratio:  0.74       1.5       Systemic Diam: 2.10 cm  Kathlyn Sacramento MD Electronically signed by Kathlyn Sacramento MD Signature Date/Time: 04/05/2019/2:46:37 PM    Final        Subjective:  Denies chest pain or shortness  of breath.  Reports to go home. Discharge Exam: Vitals:   04/06/19 0438 04/06/19 0712  BP: 118/75 117/85  Pulse: 74 76  Resp: 18 18  Temp: 98 F (36.7 C) 98.1 F (36.7 C)  SpO2: 98% 97%   Vitals:   04/05/19 1641 04/05/19 1959 04/06/19 0438 04/06/19 0712  BP: 117/84 107/74 118/75 117/85  Pulse: 75 (!) 109 74 76  Resp: Temp: (!) 97.5 F (36.4 C) 98.2 F (36.8 C) 98 F (36.7 C) 98.1 F (36.7 C)  TempSrc: Oral Oral Oral Oral  SpO2: 98% 98% 98% 97%  Weight:   94.3 kg   Height:        General: Pt is alert, awake, not in acute distress Cardiovascular: RRR, S1/S2 +, no rubs, no gallops Respiratory: CTA bilaterally, no wheezing, no rhonchi Abdominal: Soft, NT, ND, bowel sounds + Extremities: no edema, no cyanosis    The results of significant diagnostics from this hospitalization (including imaging, microbiology, ancillary and laboratory) are listed below for reference.     Microbiology: Recent Results (from the past 240 hour(s))  SARS CORONAVIRUS 2 (TAT 6-24 HRS) Nasopharyngeal Nasopharyngeal Swab     Status: None   Collection Time: 04/04/19  7:00 PM   Specimen: Nasopharyngeal Swab  Result Value Ref Range Status   SARS Coronavirus 2 NEGATIVE NEGATIVE Final    Comment: (NOTE) SARS-CoV-2 target nucleic acids are NOT DETECTED. The SARS-CoV-2 RNA is generally detectable in upper and lower respiratory  specimens during the acute phase of infection. Negative results do not preclude SARS-CoV-2 infection, do not rule out co-infections with other pathogens, and should not be used as the sole basis for treatment or other patient management decisions. Negative results must be combined with clinical observations, patient history, and epidemiological information. The expected result is Negative. Fact Sheet for Patients: HairSlick.no Fact Sheet for Healthcare Providers: quierodirigir.com This test is not yet approved or cleared by the [place] FDA and  has been authorized for detection and/or diagnosis of SARS-CoV-2 by FDA under an Emergency Use Authorization (EUA). This EUA will remain  in effect (meaning this test can be used) for the duration of the COVID-19 declaration under Section 56 4(b)(1) of the Act, 21 U.S.C. section 360bbb-3(b)(1), unless the authorization is terminated or revoked sooner. Performed at [facility], [address], [place], [place] [address]      Labs: BNP (last 3 results) No results for input(s): BNP in the last 8760 hours. Basic Metabolic Panel: Recent Labs  Lab 04/04/19 1554 04/05/19 0355 04/06/19 0521  NA 128*  --  134*  K 4.3  --  4.3  CL 94*  --  99  CO2 25  --  24  GLUCOSE 293*  --  278*  BUN 24*  --  16  CREATININE 1.05  --  1.04  CALCIUM 9.4  --  8.7*  MG  --  2.0  --    Liver Function Tests: No results for input(s): AST, ALT, ALKPHOS, BILITOT, PROT, ALBUMIN in the last 168 hours. No results for input(s): LIPASE, AMYLASE in the last 168 hours. No results for input(s): AMMONIA in the last 168 hours. CBC: Recent Labs  Lab 04/04/19 1554 04/06/19 0521  WBC 13.4* 12.6*  HGB 16.1 14.3  HCT 47.3 42.4  MCV 80.0 80.9  PLT 323 295   Cardiac Enzymes: No results for input(s): CKTOTAL, CKMB, CKMBINDEX, TROPONINI in the last 168 hours. BNP: Invalid input(s): POCBNP CBG: Recent  Labs  Lab 04/05/19 1448 04/05/19 1644 04/05/19 2108 04/06/19 0715 04/06/19 1114  GLUCAP 188* 174* 283* 262* 199*   D-Dimer No results for input(s): DDIMER in the last 72 hours. Hgb A1c Recent Labs    04/05/19 0356  HGBA1C 7.8*   Lipid Profile Recent Labs    04/05/19 0355  CHOL 141  HDL 26*  LDLCALC 90  TRIG 063  CHOLHDL 5.4   Thyroid function studies No results for input(s): TSH, T4TOTAL, T3FREE, THYROIDAB in the last 72 hours.  Invalid input(s): FREET3 Anemia work up No results for input(s): VITAMINB12, FOLATE, FERRITIN, TIBC, IRON, RETICCTPCT in the last 72 hours. Urinalysis    Component Value Date/Time   COLORURINE YELLOW (A) 10/14/2018 1423   APPEARANCEUR TURBID (A) 10/14/2018 1423   LABSPEC 1.020 10/14/2018 1423   PHURINE 8.0 10/14/2018 1423   GLUCOSEU NEGATIVE 10/14/2018 1423   HGBUR NEGATIVE 10/14/2018 1423   BILIRUBINUR NEGATIVE 10/14/2018 1423   KETONESUR NEGATIVE 10/14/2018 1423   PROTEINUR 30 (A) 10/14/2018 1423   UROBILINOGEN 0.2 02/02/2010 0740   NITRITE NEGATIVE 10/14/2018 1423   LEUKOCYTESUR NEGATIVE 10/14/2018 1423   Sepsis Labs Invalid input(s): PROCALCITONIN,  WBC,  LACTICIDVEN Microbiology Recent Results (from the past 240 hour(s))  SARS CORONAVIRUS 2 (TAT 6-24 HRS) Nasopharyngeal Nasopharyngeal Swab     Status: None   Collection Time: 04/04/19  7:00 PM   Specimen: Nasopharyngeal Swab  Result Value Ref Range Status   SARS Coronavirus 2 NEGATIVE NEGATIVE Final    Comment: (NOTE) SARS-CoV-2 target nucleic acids are NOT DETECTED. The SARS-CoV-2 RNA is generally detectable in upper and lower respiratory specimens during the acute phase of infection. Negative results do not preclude SARS-CoV-2 infection, do not rule out co-infections with other pathogens, and should not be used as the sole basis for treatment or other patient management decisions. Negative results must be combined with clinical observations, patient history, and  epidemiological information. The expected result is Negative. Fact Sheet for Patients: HairSlick.no Fact Sheet for Healthcare Providers: quierodirigir.com This test is not yet approved or cleared by the [place] FDA and  has been authorized for detection and/or diagnosis of SARS-CoV-2 by FDA under an Emergency Use Authorization (EUA). This EUA will remain  in effect (meaning this test can be used) for the duration of the COVID-19 declaration under Section 56 4(b)(1) of the Act, 21 U.S.C. section 360bbb-3(b)(1), unless the authorization is terminated or revoked sooner. Performed at [facility], [address], [place], [place] [address]      Time coordinating discharge: Over 30 minutes  SIGNED:   Lynn Ito, MD  [facility] 04/06/2019, 12:09 PM Pager   If 7PM-7AM, please contact night-coverage [email or web address] Password [email or web address]

## 2019-04-06 NOTE — Progress Notes (Addendum)
Initial Nutrition Assessment  DOCUMENTATION CODES:   Not applicable  INTERVENTION:   -Education on how to make healthier choices when eating out.   NUTRITION DIAGNOSIS:   Food and nutrition related knowledge deficit related to limited prior education, chronic illness(ischemic cardiomyopathy s/p left heart catheterization; EF 30-35%) as evidenced by per patient/family report(daily intake of fast foods high in sodium and saturated fat).    GOAL:   Patient will meet greater than or equal to 90% of their needs    MONITOR:   Labs, Weight trends, PO intake  REASON FOR ASSESSMENT:   Malnutrition Screening Tool    ASSESSMENT:  RD working remotely.  48 year old male with past medical history of T2DM, HTN, and seizure disorder who presented to ED with complaints of left-sided parasternal chest pain. Patient reports chest pain as dull and aching with radiation to both shoulders. Troponin was 19 and later 159; CXR showed cholelithiasis without acute cardiopulmonary disease and admitted to observation telemetry bed for further evaluation.  Patient is s/p left heart catheterization revealing proximal LAD lesion, 8-% stenosed and mid LAD lesion 80% stenosed. Successful PCI with two overlapping stents on 12/22.  12/22 Echo - ischemic cardiomyopathy; EF 30-35%  Patient eating 0-100% x 2 recorded meals this admission.   Spoke with pt via phone this morning. He reports that he is feeling good today and getting ready to go home. Patient states that recently he has been trying to cut back po intake and lose wt. Patient reports daily intake of fast foods and recalls sausage biscuit or bacon egg and cheese biscuit for breakfast, he eats half of his [employer] for lunch and the other half for dinner. Patient recalls drinking 4 diet Coke's daily.   RD educated patient on how to make healthier choices when eating out (asking for no added salt, choosing grilled vs fried, fruit as a side and  increasing water intake) Patient stated "yeah I know, my nurse is in here and I have to go"   I/Os: -3055 since admit     -2950 ml x 24 hrs UOP: 2950 ml x 24 hrs  Current wt 94.3 kg (207.46 lbs) Per history, pt has lost 7 lbs over the past 5 months which is insignificant for time frame.   Medications reviewed and include: SS novolog  Labs: CBGs 174-283 x 24 hrs, Na 134 (L), WBC 12.6 (H), Troponin 50354 (H)   NUTRITION - FOCUSED PHYSICAL EXAM: Unable to complete at this time, RD working remotely.  Diet Order:   Diet Order            Diet Heart Room service appropriate? Yes; Fluid consistency: Thin  Diet effective now              EDUCATION NEEDS:   Education needs have been addressed  Skin:  Skin Assessment: Reviewed RN Assessment  Last BM:  12/21  Height:   Ht Readings from Last 1 Encounters:  04/05/19 5\' 10"  (1.778 m)    Weight:   Wt Readings from Last 1 Encounters:  04/06/19 94.3 kg    Ideal Body Weight:  75.5 kg  BMI:  Body mass index is 29.83 kg/m.  Estimated Nutritional Needs:   Kcal:  2000-2200  Protein:  100-110  Fluid:  >/= 2 L/day   Lajuan Lines, RD, LDN Clinical Nutrition [facility] Telephone [phone] After Hours/Weekend Pager: [phone]

## 2019-04-06 NOTE — Progress Notes (Signed)
[facility] Cardiology    SUBJECTIVE: Mr. Brann is a 48 year old male with a past medical history significant for type 2 diabetes, seizure disorder, hyperlipidemia, hypertension, and family history of CAD who presented to the ED on 04/04/19 for a 2 hour history of left-sided chest pain, radiating between his shoulder blades, with associated diaphoresis.  Workup in the ED included high sensitivity troponin of 19, 159, and >27,000 respectively, initial ECG revealing sinus rhythm with repeat ECG revealing inferior Q waves and a new LBBB, and chest xray negative for acute cardiopulmonary disease.  He underwent left heart catheterization revealing a proximal LAD lesion, 80% stenosed and a mid LAD lesion 80% stenosed.  Successful PCI with two overlapping stents (2.5x 86mm and 2.75 x 48mm Resolute Onyx) in the proximal and mid LAD was performed.   Since the procedure, Mr. Medlock reports that he has been doing well and denies chest pain, palpitations, shortness of breath, lower extremity swelling, orthopnea, PND, dizziness, lightheadedness, or syncopal/presyncopal episodes.  Denies bleeding or drainage from right femoral access site.    Vitals:   04/05/19 1641 04/05/19 1959 04/06/19 0438 04/06/19 0712  BP: 117/84 107/74 118/75 117/85  Pulse: 75 (!) 109 74 76  Resp: 18 20 18 18   Temp: (!) 97.5 F (36.4 C) 98.2 F (36.8 C) 98 F (36.7 C) 98.1 F (36.7 C)  TempSrc: Oral Oral Oral Oral  SpO2: 98% 98% 98% 97%  Weight:   94.3 kg   Height:         Intake/Output Summary (Last 24 hours) at 04/06/2019 8338 Last data filed at 04/06/2019 0444 Gross per 24 hour  Intake --  Output 2950 ml  Net -2950 ml      PHYSICAL EXAM  General: Well developed, well nourished, in no acute distress HEENT:  Normocephalic and atramatic Neck:  No JVD.  Lungs: Clear bilaterally to auscultation and percussion. Heart: HRRR . Normal S1 and S2 without gallops or murmurs.  Abdomen: Bowel sounds are positive, abdomen soft and  non-tender  Msk:  Back normal.  Normal strength and tone for age. Extremities: No clubbing, cyanosis or edema.  Right femoral access site, with dressing in place, clean, dry, intact Neuro: Alert and oriented X 3. Psych:  Good affect, responds appropriately   LABS: Basic Metabolic Panel: Recent Labs    04/04/19 1554 04/05/19 0355 04/06/19 0521  NA 128*  --  134*  K 4.3  --  4.3  CL 94*  --  99  CO2 25  --  24  GLUCOSE 293*  --  278*  BUN 24*  --  16  CREATININE 1.05  --  1.04  CALCIUM 9.4  --  8.7*  MG  --  2.0  --    Liver Function Tests: No results for input(s): AST, ALT, ALKPHOS, BILITOT, PROT, ALBUMIN in the last 72 hours. No results for input(s): LIPASE, AMYLASE in the last 72 hours. CBC: Recent Labs    04/04/19 1554 04/06/19 0521  WBC 13.4* 12.6*  HGB 16.1 14.3  HCT 47.3 42.4  MCV 80.0 80.9  PLT 323 295   Cardiac Enzymes: No results for input(s): CKTOTAL, CKMB, CKMBINDEX, TROPONINI in the last 72 hours. BNP: Invalid input(s): POCBNP D-Dimer: No results for input(s): DDIMER in the last 72 hours. Hemoglobin A1C: Recent Labs    04/05/19 0356  HGBA1C 7.8*   Fasting Lipid Panel: Recent Labs    04/05/19 0355  CHOL 141  HDL 26*  LDLCALC 90  TRIG 123  CHOLHDL 5.4   Thyroid Function Tests: No results for input(s): TSH, T4TOTAL, T3FREE, THYROIDAB in the last 72 hours.  Invalid input(s): FREET3 Anemia Panel: No results for input(s): VITAMINB12, FOLATE, FERRITIN, TIBC, IRON, RETICCTPCT in the last 72 hours.  DG Chest 2 View  Result Date: 04/04/2019 CLINICAL DATA:  Intermittent chest pain for the past 1.5 hours. History of hypertension with recent medication change. EXAM: CHEST - 2 VIEW COMPARISON:  01/31/2010. FINDINGS: Normal sized heart. Clear lungs with normal vascularity. Unremarkable bones. Multiple small gallstones layering in the dependent portion of the gallbladder. IMPRESSION: 1. No acute abnormality. 2. Cholelithiasis. Electronically Signed    By: Beckie Salts M.D.   On: 04/04/2019 16:24   CARDIAC CATHETERIZATION  Result Date: 04/05/2019  Prox LAD to Mid LAD lesion is 99% stenosed.  There is moderate left ventricular systolic dysfunction.  LV end diastolic pressure is normal.  The left ventricular ejection fraction is 35-45% by visual estimate.  There is trivial (1+) mitral regurgitation.  Mid Cx lesion is 50% stenosed.  Occluded proximal lad with timi1-2 antegrade flow. Antero apical akinesis with ef of 40-45% Referred to Dr. Darrold Junker for consideration for pci of lad.   CARDIAC CATHETERIZATION  Result Date: 04/05/2019  Prox LAD lesion is 80% stenosed.  Mid LAD lesion is 80% stenosed.  A drug-eluting stent was successfully placed using a STENT RESOLUTE ONYX 2.5X18.  Post intervention, there is a 0% residual stenosis.  A drug-eluting stent was successfully placed using a STENT RESOLUTE ONYX [id number].  Post intervention, there is a 0% residual stenosis.  1.  Non-ST elevation myocardial infarction 2.  High-grade 80% stenosis proximal LAD, 80% stenosis mid LAD with TIMI II flow 3.  Successful PCI with overlapping 2.5 x 18 mm and 2.75 x 22 mm Resolute Onyx stents in proximal and mid LAD with mild no reflow phenomena treated with Aggrastat bolus and infusion Recommendations 1.  Dual antiplatelet therapy uninterrupted for 1 year 2.  Aggrastat infusion x18 hours   ECHOCARDIOGRAM COMPLETE  Result Date: 04/05/2019   ECHOCARDIOGRAM REPORT   Patient Name:   GRAYSEN WOODYARD [facility] Date of Exam: 04/05/2019 Medical Rec #:  [id number]     Height:       70.0 in Accession #:    [id number]    Weight:       206.0 lb Date of Birth:  [birth year]     BSA:          2.11 m Patient Age:    48 years      BP:           109/72 mmHg Patient Gender: M             HR:           66 bpm. Exam Location:  [facility] Procedure: 2D Echo, Cardiac Doppler and Color Doppler Indications:     Elevated troponin  History:         Patient has no prior history of Echocardiogram examinations.                   Risk Factors:Hypertension and Diabetes.  Sonographer:     Cristela Blue RDCS (AE) Referring Phys:  [id number] Vernetta Honey MANSY Diagnosing Phys: Lorine Bears MD IMPRESSIONS  1. Left ventricular ejection fraction, by visual estimation, is 30 to 35%. The left ventricle has moderate to severely decreased function. There is moderately increased left ventricular hypertrophy.  2. Severe hypokinesis of the left ventricular, mid-apical anteroseptal wall, anterior wall,  anterolateral wall and apical segment.  3. Left ventricular diastolic parameters are consistent with Grade I diastolic dysfunction (impaired relaxation).  4. The left ventricle demonstrates regional wall motion abnormalities.  5. Global right ventricle has normal systolic function.The right ventricular size is normal. No increase in right ventricular wall thickness.  6. Left atrial size was normal.  7. Right atrial size was normal.  8. The mitral valve is normal in structure. No evidence of mitral valve regurgitation. No evidence of mitral stenosis.  9. The tricuspid valve is normal in structure. Tricuspid valve regurgitation is not demonstrated. 10. The aortic valve is normal in structure. Aortic valve regurgitation is not visualized. No evidence of aortic valve sclerosis or stenosis. 11. The pulmonic valve was normal in structure. Pulmonic valve regurgitation is not visualized. 12. TR signal is inadequate for assessing pulmonary artery systolic pressure. FINDINGS  Left Ventricle: Left ventricular ejection fraction, by visual estimation, is 30 to 35%. The left ventricle has moderate to severely decreased function. Severe hypokinesis of the left ventricular, mid-apical anteroseptal wall, anterior wall, anterolateral wall and apical segment. The left ventricle demonstrates regional wall motion abnormalities. There is moderately increased left ventricular hypertrophy. Left ventricular diastolic parameters are consistent with Grade I diastolic dysfunction   (impaired relaxation). Normal left atrial pressure. Right Ventricle: The right ventricular size is normal. No increase in right ventricular wall thickness. Global RV systolic function is has normal systolic function. The tricuspid regurgitant velocity is 1.59 m/s, and with an assumed right atrial pressure  of 10 mmHg, the estimated right ventricular systolic pressure is TR signal is inadequate for assessing PA pressure at 20.1 mmHg. Left Atrium: Left atrial size was normal in size. Right Atrium: Right atrial size was normal in size Pericardium: There is no evidence of pericardial effusion. Mitral Valve: The mitral valve is normal in structure. No evidence of mitral valve regurgitation. No evidence of mitral valve stenosis by observation. Tricuspid Valve: The tricuspid valve is normal in structure. Tricuspid valve regurgitation is not demonstrated. Aortic Valve: The aortic valve is normal in structure. Aortic valve regurgitation is not visualized. The aortic valve is structurally normal, with no evidence of sclerosis or stenosis. Aortic valve mean gradient measures 1.5 mmHg. Aortic valve peak gradient measures 2.9 mmHg. Aortic valve area, by VTI measures 3.92 cm. Pulmonic Valve: The pulmonic valve was normal in structure. Pulmonic valve regurgitation is not visualized. Pulmonic regurgitation is not visualized. Aorta: The aortic root, ascending aorta and aortic arch are all structurally normal, with no evidence of dilitation or obstruction. Venous: The inferior vena cava was not well visualized. IAS/Shunts: No atrial level shunt detected by color flow Doppler. There is no evidence of a patent foramen ovale. No ventricular septal defect is seen or detected. There is no evidence of an atrial septal defect.  LEFT VENTRICLE PLAX 2D LVIDd:         3.43 cm  Diastology LVIDs:         2.53 cm  LV e' lateral:   8.59 cm/s LV PW:         1.24 cm  LV E/e' lateral: 5.8 LV IVS:        1.54 cm  LV e' medial:    3.81 cm/s LVOT  diam:     2.10 cm  LV E/e' medial:  13.1 LV SV:         25 ml LV SV Index:   11.73 LVOT Area:     3.46 cm  RIGHT VENTRICLE RV Basal  diam:  2.70 cm RV S prime:     12.30 cm/s TAPSE (M-mode): 3.0 cm LEFT ATRIUM             Index       RIGHT ATRIUM          Index LA diam:        3.90 cm 1.85 cm/m  RA Area:     7.38 cm LA Vol (A2C):   32.8 ml 15.52 ml/m RA Volume:   11.70 ml 5.54 ml/m LA Vol (A4C):   30.8 ml 14.57 ml/m LA Biplane Vol: 32.8 ml 15.52 ml/m  AORTIC VALVE                   PULMONIC VALVE AV Area (Vmax):    2.69 cm    PV Vmax:       0.71 m/s AV Area (Vmean):   3.18 cm    PV Peak grad:  2.0 mmHg AV Area (VTI):     3.92 cm AV Vmax:           84.65 cm/s AV Vmean:          58.350 cm/s AV VTI:            0.118 m AV Peak Grad:      2.9 mmHg AV Mean Grad:      1.5 mmHg LVOT Vmax:         65.80 cm/s LVOT Vmean:        53.500 cm/s LVOT VTI:          0.133 m LVOT/AV VTI ratio: 1.13  AORTA Ao Root diam: 3.10 cm MITRAL VALVE                        TRICUSPID VALVE MV Area (PHT): 3.72 cm             TR Peak grad:   10.1 mmHg MV PHT:        59.16 msec           TR Vmax:        159.00 cm/s MV Decel Time: 204 msec MV E velocity: 50.00 cm/s 103 cm/s  SHUNTS MV A velocity: 67.90 cm/s 70.3 cm/s Systemic VTI:  0.13 m MV E/A ratio:  0.74       1.5       Systemic Diam: 2.10 cm  Lorine BearsMuhammad Arida MD Electronically signed by Lorine BearsMuhammad Arida MD Signature Date/Time: 04/05/2019/2:46:37 PM    Final      Echo: Moderately reduced LV systolic function with an EF estimated between 30-35% with moderate LVH, grade 1 diastolic dysfunction.  No significant valvular abnormalities   TELEMETRY: Sinus rhythm   ASSESSMENT AND PLAN:  Active Problems:   Chest pain   NSTEMI (non-ST elevated myocardial infarction) (HCC)   Morbid obesity (HCC)   Leucocytosis   DM2 (diabetes mellitus, type 2) (HCC)   HTN (hypertension)    1. NSTEMI   -With successful PCI to proximal and mid LAD with 2 overlapping stents   -Will continue  uninterrupted, dual antiplatelet therapy of aspirin 81mg  daily and Brilinta 90mg  BID for 1 year   -Continue atorvastatin 40mg  daily   -Okay to discharge from a cardiac standpoint; cardiac rehab recommended with outpatient follow up with Dr. Lady GaryFath or Marga HootsNicole Josiephine Simao within 7-10 days of discharge   2.  Ischemic cardiomyopathy   -Echo revealing EF between 30-35%; will switch from metoprolol tartrate to metoprolol succinate 25mg   daily   -Continue lisinopril  daily   -Will make further medication adjustments in an outpatient setting   3.  Type 2 Diabetes   -Okay to resume Metformin tomorrow  4.  Hypertension   -Continue lisinopril  daily, metoprolol succinate  daily, and hydrochlorothiazide 12.5mg  daily    The history, physical exam findings, and plan of care were all discussed with Dr. Harold Hedge, and all decision making was made in collaboration    Andi Hence  [facility] 04/06/2019 7:33 AM

## 2019-04-06 NOTE — Progress Notes (Signed)
Cardiovascular and Pulmonary Nurse Navigator Note:   48 y.o. Caucasian male with a known history of type 2 diabetes mellitus, hypertension and seizure disorder, presented to the emergency room with chest pain.  Patient ruled in for NSTEMI.    Douglas Spray, MD (Primary)  Avie Arenas, PA-C  Procedures 04/05/2019:   LEFT HEART CATH AND CORONARY ANGIOGRAPHY  Conclusion    Prox LAD to Mid LAD lesion is 99% stenosed. There is moderate left ventricular systolic dysfunction. LV end diastolic pressure is normal. The left ventricular ejection fraction is 35-45% by visual estimate. There is trivial (1+) mitral regurgitation. Mid Cx lesion is 50% stenosed.   Occluded proximal lad with timi1-2 antegrade flow. Antero apical akinesis with ef of 40-45% Referred to Dr. Saralyn Pilar for consideration for pci of lad.    Douglas Cowman, MD (Primary)  Avie Arenas, PA-C  Procedures 04/05/2019 CORONARY STENT INTERVENTION  Conclusion    Prox LAD lesion is 80% stenosed. Mid LAD lesion is 80% stenosed. A drug-eluting stent was successfully placed using a STENT RESOLUTE ONYX 2.5X18. Post intervention, there is a 0% residual stenosis. A drug-eluting stent was successfully placed using a STENT RESOLUTE ONYX [id number]. Post intervention, there is a 0% residual stenosis.   1.  Non-ST elevation myocardial infarction 2.  High-grade 80% stenosis proximal LAD, 80% stenosis mid LAD with TIMI II flow 3.  Successful PCI with overlapping 2.5 x 18 mm and 2.75 x 22 mm Resolute Onyx stents in proximal and mid LAD with mild no reflow phenomena treated with Aggrastat bolus and infusion   Recommendations   1.  Dual antiplatelet therapy uninterrupted for 1 year 2.  Aggrastat infusion x18 hours    04/03/2019 - Echo:   1. Left ventricular ejection fraction, by visual estimation, is 30 to 35%. The left ventricle has moderate to severely decreased function. There is moderately increased left  ventricular hypertrophy. 2. Severe hypokinesis of the left ventricular, mid-apical anteroseptal wall, anterior wall, anterolateral wall and apical segment. 3. Left ventricular diastolic parameters are consistent with Grade I diastolic dysfunction (impaired relaxation). 4. The left ventricle demonstrates regional wall motion abnormalities. 5. Global right ventricle has normal systolic function.The right ventricular size is normal. No increase in right ventricular wall thickness. 6. Left atrial size was normal. 7. Right atrial size was normal. 8. The mitral valve is normal in structure. No evidence of mitral valve regurgitation. No evidence of mitral stenosis. 9. The tricuspid valve is normal in structure. Tricuspid valve regurgitation is not demonstrated. 10. The aortic valve is normal in structure. Aortic valve regurgitation is not visualized. No evidence of aortic valve sclerosis or stenosis. 11. The pulmonic valve was normal in structure. Pulmonic valve regurgitation is not visualized.  EDUCATION:   This RN rounded on patient.  Patient lying in bed with head of bed elevated.  Patient's son at bedside.  Patient gave verbal permission for this RN to speak about his health in front of his son.    "Heart Attack Bouncing Back" booklet given and reviewed with patient. Discussed the definition of CAD. Reviewed the location of CAD and where his stent was placed. Informed patient he will be given a stent card. Explained the purpose of the stent card. Instructed patient to keep stent card in his wallet.   Discussed modifiable risk factors including controlling blood pressure, cholesterol, and blood sugar; following heart healthy diet; maintaining healthy weight; exercise; and smoking cessation, if applicable. Patient chews / dips tobacco.    Discussed  cardiac medications including rationale for taking, mechanisms of action, and side effects. Stressed the importance of taking medications as  prescribed. Brilinta coupon for [employer] given to patient.    Discussed emergency plan for heart attack symptoms. Patient verbalized understanding of need to call 911 and not to drive himself to ER if having cardiac symptoms / chest pain.   Diet of low sodium, low fat, low cholesterol heart healthy / carb modified diet discussed. Information on diet provided.    Tobacco Cessation - Patient reports that he does not smoke, but dips / chews tobacco. Patient has been advised to quit dipping.  Information on tobacco cessation provided.    Exercise - Benefits of exercised discussed. Patient is a Agricultural engineer.  Informed patient his cardiologist has referred him to outpatient Cardiac Rehab. An overview of the program was provided. Informational letter with CPT billing codes given to patient. Patient is interested in participating. Patient plans to check with his insurance company to see what his out-of-pocket expenses will be.  American Heart Association guidelines for exercise of 150 minutes per week of moderate exercise (walking) discussed with patient.  Discussed with patient a walking plan with patient in the event patient is unable to participate in Cardiac Rehab.  At present, patient is agreeable to being contacted by phone after the holidays to schedule his first appointment.    Patient appreciative of the above information.    Army Melia, RN, BSN, [facility]  [place]  [facility] Cardiac & Pulmonary Rehab  Cardiovascular & Pulmonary Nurse Navigator  Direct Line: [phone]  Department Phone #: [phone] Fax: [phone]  Email Address: [email or web address]

## 2019-11-30 ENCOUNTER — Emergency Department
Admission: EM | Admit: 2019-11-30 | Discharge: 2019-11-30 | Disposition: A | Discharge status: Home / Self Care | Payer: BC Managed Care – PPO | Attending: Emergency Medicine | Admitting: Emergency Medicine

## 2019-11-30 ENCOUNTER — Other Ambulatory Visit: Payer: Self-pay

## 2019-11-30 DIAGNOSIS — R109 Unspecified abdominal pain: Secondary | ICD-10-CM | POA: Diagnosis not present

## 2019-11-30 DIAGNOSIS — Z5321 Procedure and treatment not carried out due to patient leaving prior to being seen by health care provider: Secondary | ICD-10-CM | POA: Insufficient documentation

## 2019-11-30 DIAGNOSIS — R300 Dysuria: Secondary | ICD-10-CM | POA: Diagnosis not present

## 2019-11-30 DIAGNOSIS — R197 Diarrhea, unspecified: Secondary | ICD-10-CM | POA: Insufficient documentation

## 2019-11-30 LAB — COMPREHENSIVE METABOLIC PANEL
ALT: 17 U/L (ref 0–44)
AST: 16 U/L (ref 15–41)
Albumin: 4.4 g/dL (ref 3.5–5.0)
Alkaline Phosphatase: 37 U/L — ABNORMAL LOW (ref 38–126)
Anion gap: 15 (ref 5–15)
BUN: 5 mg/dL — ABNORMAL LOW (ref 6–20)
CO2: 22 mmol/L (ref 22–32)
Calcium: 9.2 mg/dL (ref 8.9–10.3)
Chloride: 93 mmol/L — ABNORMAL LOW (ref 98–111)
Creatinine, Ser: 1.51 mg/dL — ABNORMAL HIGH (ref 0.61–1.24)
GFR calc Af Amer: >60 mL/min (ref >60)
GFR calc non Af Amer: 54 mL/min — ABNORMAL LOW (ref >60)
Glucose, Bld: 145 mg/dL — ABNORMAL HIGH (ref 70–99)
Potassium: 4.4 mmol/L (ref 3.5–5.1)
Sodium: 130 mmol/L — ABNORMAL LOW (ref 135–145)
Total Bilirubin: 0.7 mg/dL (ref 0.3–1.2)
Total Protein: 7.8 g/dL (ref 6.5–8.1)

## 2019-11-30 LAB — LIPASE, BLOOD: Lipase: 23 U/L (ref 11–51)

## 2019-11-30 LAB — CBC
HCT: 39.8 % (ref 39.0–52.0)
Hemoglobin: 14.1 g/dL (ref 13.0–17.0)
MCH: 28.7 pg (ref 26.0–34.0)
MCHC: 35.4 g/dL (ref 30.0–36.0)
MCV: 80.9 fL (ref 80.0–100.0)
Platelets: 305 10*3/uL (ref 150–400)
RBC: 4.92 MIL/uL (ref 4.22–5.81)
RDW: 13.2 % (ref 11.5–15.5)
WBC: 13.4 10*3/uL — ABNORMAL HIGH (ref 4.0–10.5)
nRBC: 0 % (ref 0.0–0.2)

## 2019-11-30 LAB — URINALYSIS, COMPLETE (UACMP) WITH MICROSCOPIC
Bacteria, UA: NONE SEEN
Bilirubin Urine: NEGATIVE
Glucose, UA: 150 mg/dL — AB
Hgb urine dipstick: NEGATIVE
Ketones, ur: 20 mg/dL — AB
Leukocytes,Ua: NEGATIVE
Nitrite: NEGATIVE
Protein, ur: NEGATIVE mg/dL
Specific Gravity, Urine: 1.015 (ref 1.005–1.030)
Squamous Epithelial / HPF: NONE SEEN (ref 0–5)
pH: 5 (ref 5.0–8.0)

## 2019-11-30 NOTE — ED Triage Notes (Addendum)
PT arrives via POV from home for reports of abdominal pain and diarrhea that started around 430 this morning. Pt reports 5 episodes of diarrhea today. PT ambulatory from lobby in NAD, skin warm and dry. Pt reports dysuria this morning and states it was difficult to urinate this morning.

## 2019-12-01 ENCOUNTER — Telehealth: Payer: Self-pay | Admitting: Emergency Medicine

## 2019-12-01 NOTE — Telephone Encounter (Signed)
Called patient due to lwot to inquire about condition and follow up plans. Says he is completely better and that it was 24 hour bug.  I told him that his labs are available for his doctor to review.

## 2020-04-02 ENCOUNTER — Encounter: Payer: Self-pay | Admitting: Emergency Medicine

## 2020-12-06 IMAGING — CR DG CHEST 2V
2 series · 2 of 2 positions shown · non-contrast
Comparison: 01/31/2010.

CLINICAL DATA: Intermittent chest pain for the past 1.5 hours.
History of hypertension with recent medication change.

EXAM:
CHEST - 2 VIEW

[chest pa]
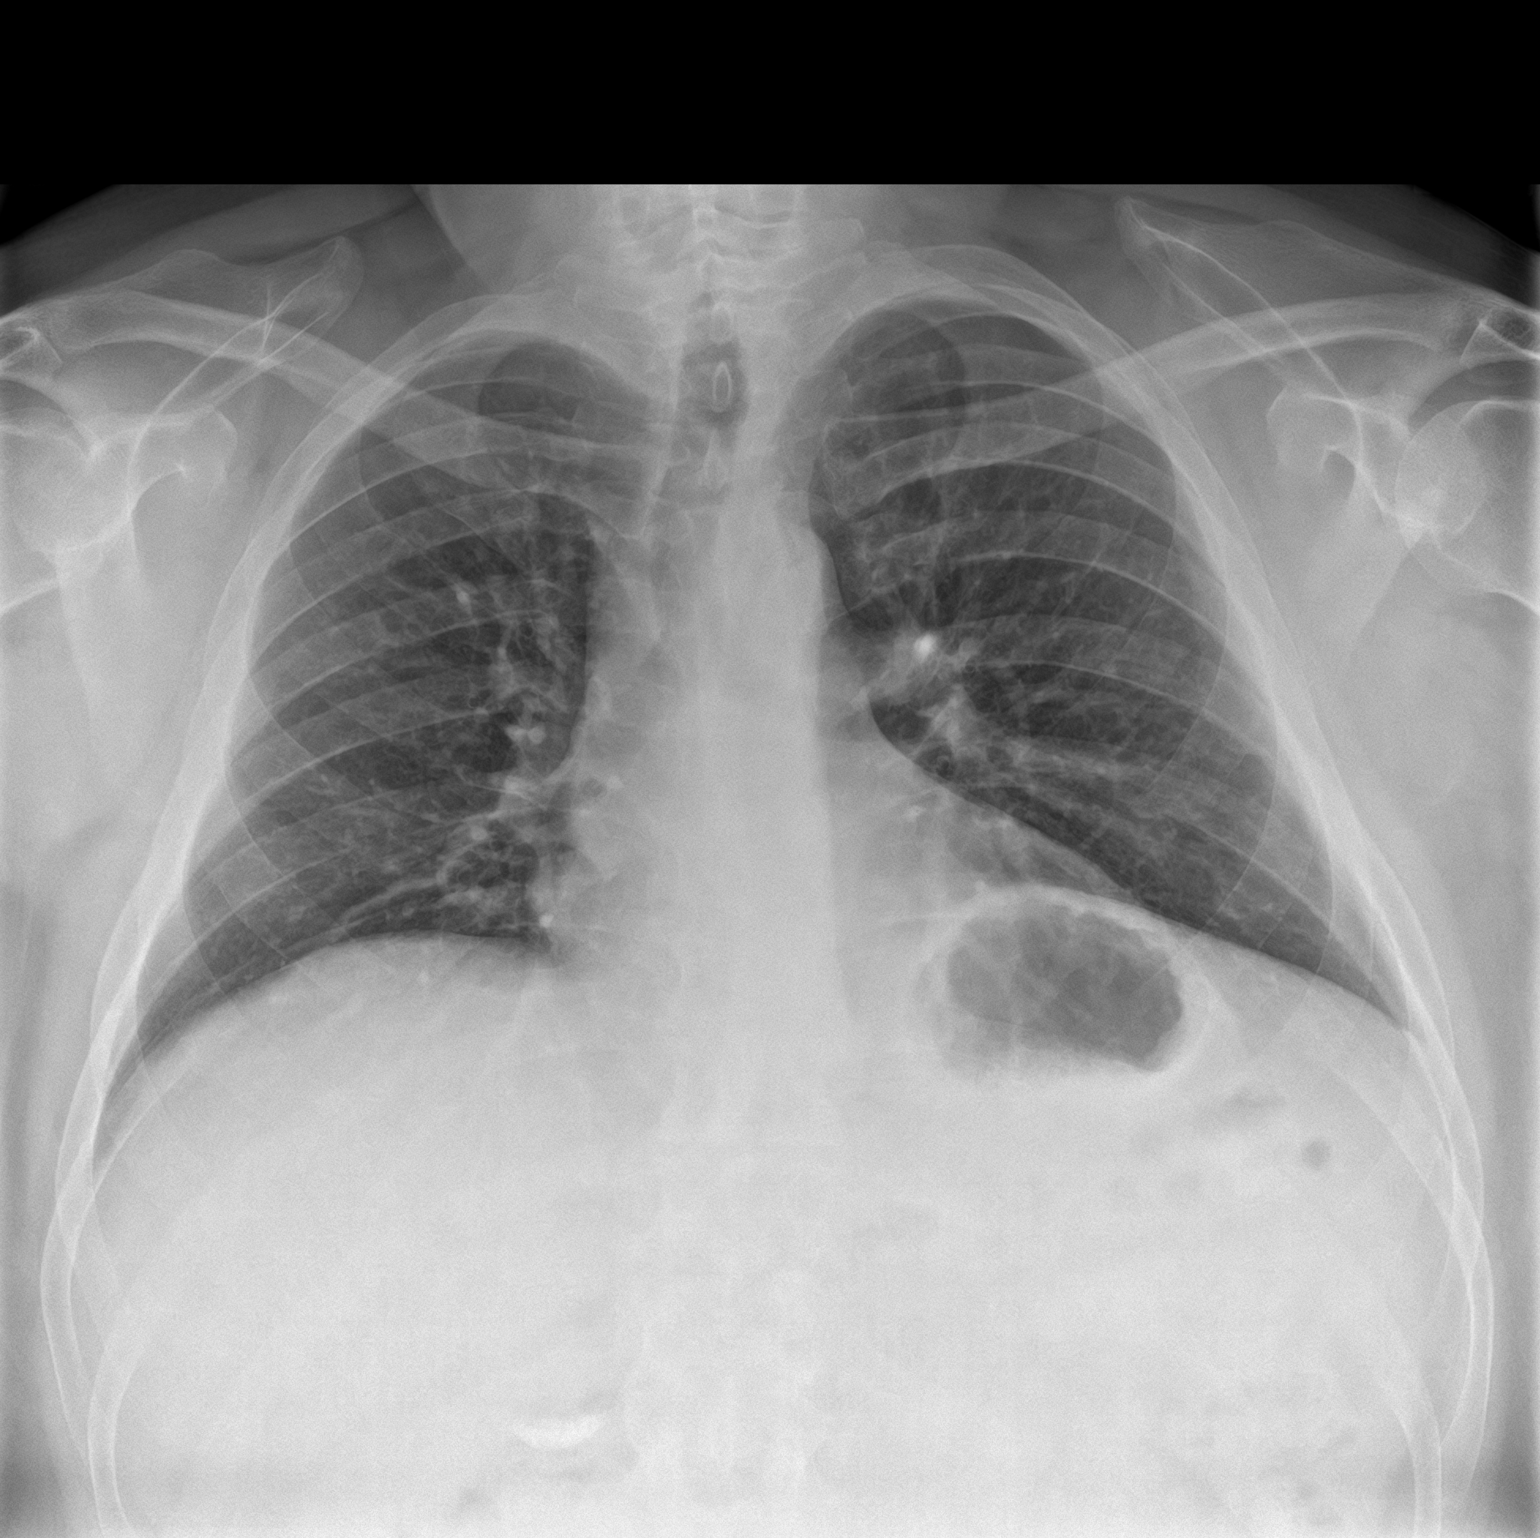

[chest lat]
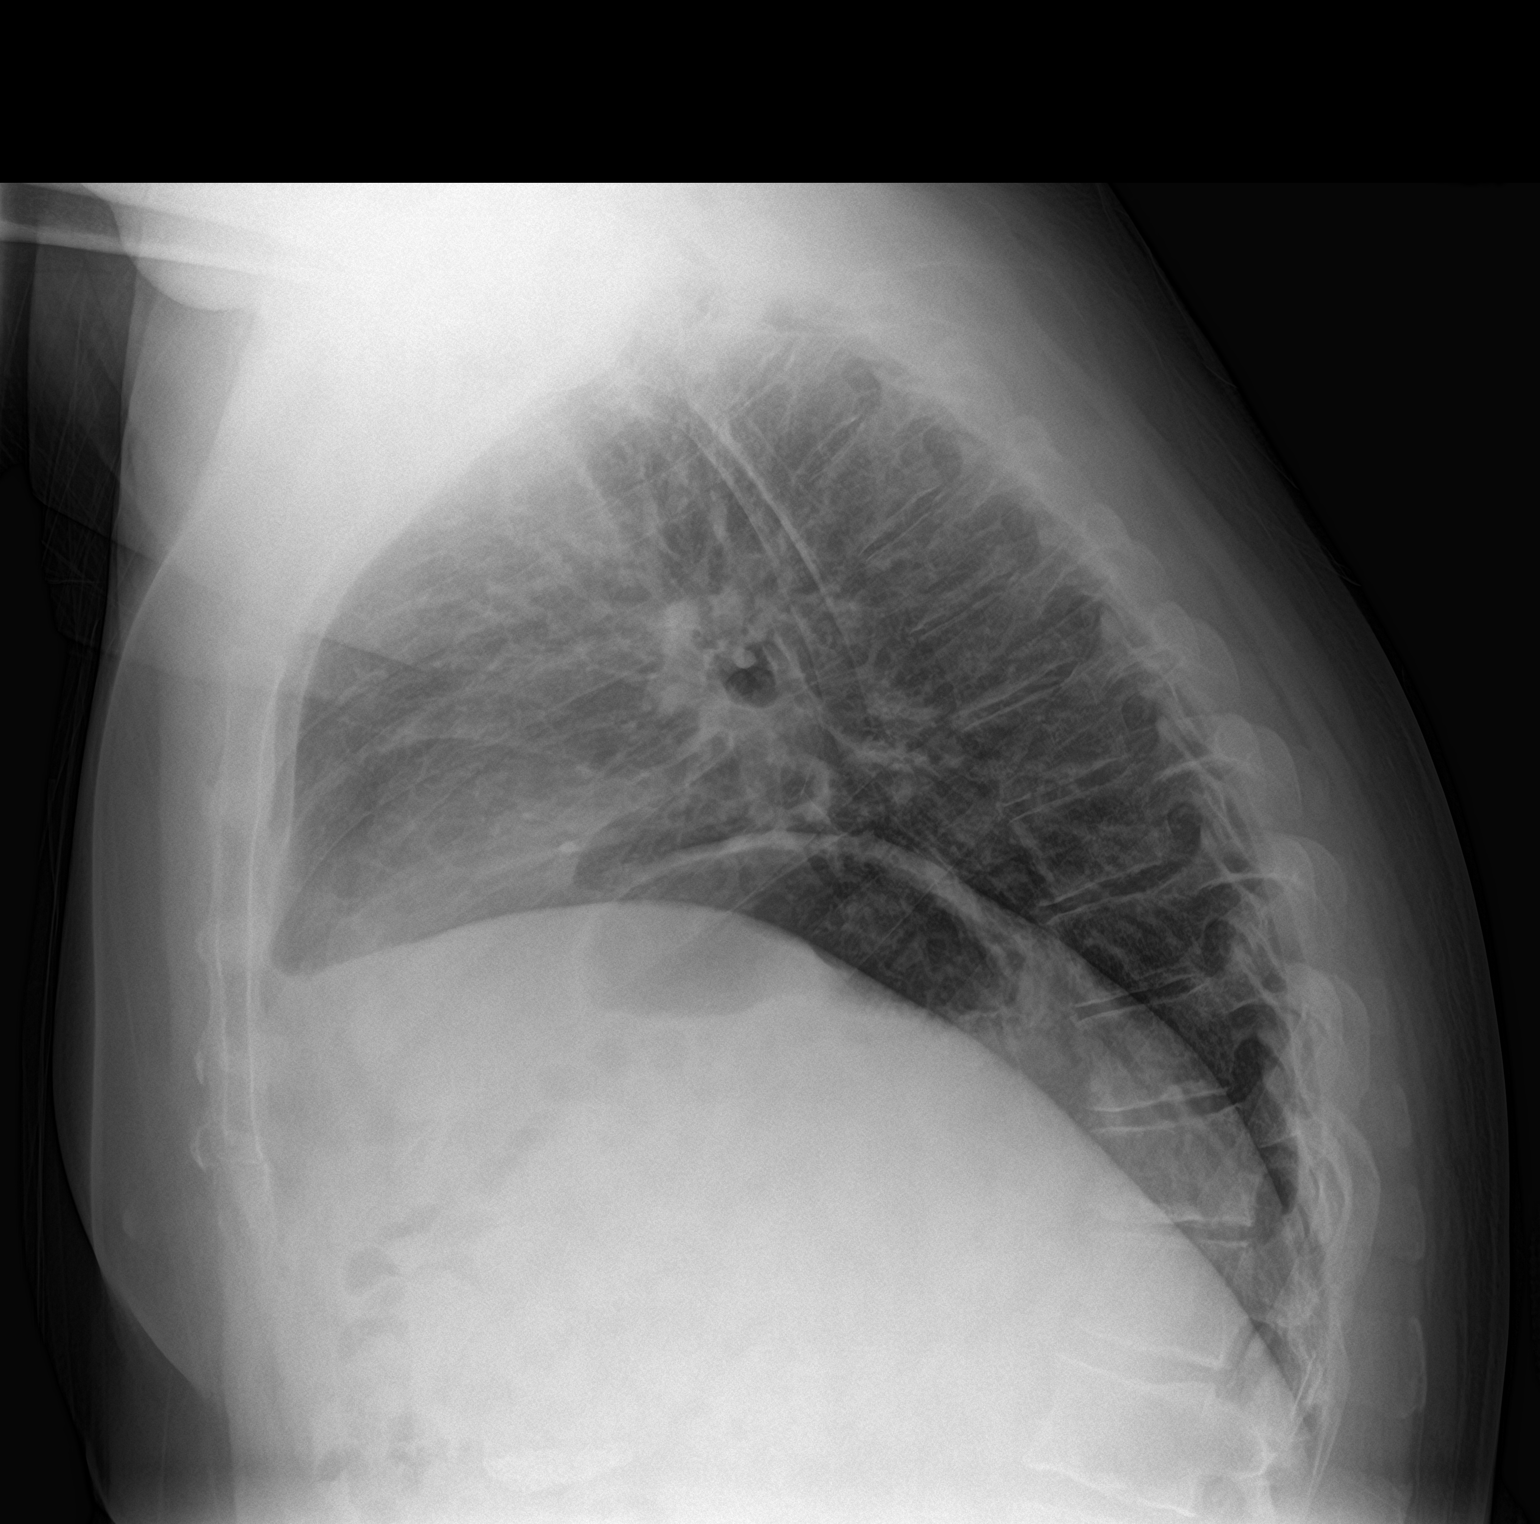

[2 of 2 positions shown; findings below may reference images not displayed]

FINDINGS: Normal sized heart. Clear lungs with normal vascularity.
Unremarkable bones. Multiple small gallstones layering in the
dependent portion of the gallbladder.
IMPRESSION: 1. No acute abnormality.
2. Cholelithiasis.

## 2022-12-02 ENCOUNTER — Telehealth: Payer: Self-pay

## 2022-12-02 NOTE — Telephone Encounter (Signed)
Patient called the wrong GI office. Patient needed to cancel his procedure.

## 2023-07-17 ENCOUNTER — Encounter: Payer: Self-pay | Admitting: Gastroenterology

## 2023-08-06 ENCOUNTER — Encounter: Payer: Self-pay | Admitting: Gastroenterology

## 2023-08-13 NOTE — H&P (Signed)
 Pre-Procedure H&P   Patient ID: Douglas Young is a 53 y.o. male.  Gastroenterology Provider: Quintin Buckle, DO  Referring Provider: Jamee Mazzoni, PA PCP: [facility]  Date: 08/14/2023  HPI Douglas Young is a 53 y.o. male who presents today for Colonoscopy for Colorectal cancer screening .  Undergoing initial colorectal cancer screening.  No family history of colon cancer or colon polyps.  Currently no GI symptoms.  History of CAD ischemic cardiomyopathy and heart failure.  Most recent EF 50 to 55%.  Plavix has been held for this procedure.   Past Medical History:  Diagnosis Date   Coronary artery disease    Diabetes mellitus without complication (HCC)    DM2 (diabetes mellitus, type 2) (HCC)    Heart failure with reduced ejection fraction (HCC)    Hypertension    Ischemic cardiomyopathy    Mixed hyperlipidemia    Myocardial infarction [facility])    NSTEMI (non-ST elevated myocardial infarction) (HCC)    Seizures (HCC)     Past Surgical History:  Procedure Laterality Date   BACK SURGERY     CORONARY STENT INTERVENTION N/A 04/05/2019   Procedure: CORONARY STENT INTERVENTION;  Surgeon: Percival Brace, MD;  Location: [facility] INVASIVE CV LAB;  Service: Cardiovascular;  Laterality: N/A;   LEFT HEART CATH AND CORONARY ANGIOGRAPHY N/A 04/05/2019   Procedure: LEFT HEART CATH AND CORONARY ANGIOGRAPHY;  Surgeon: Ronney Cola, MD;  Location: [facility] INVASIVE CV LAB;  Service: Cardiovascular;  Laterality: N/A;   SKIN GRAFT      Family History No h/o GI disease or malignancy  Review of Systems  Constitutional:  Negative for activity change, appetite change, chills, diaphoresis, fatigue, fever and unexpected weight change.  HENT:  Negative for trouble swallowing and voice change.   Respiratory:  Negative for shortness of breath and wheezing.   Cardiovascular:  Negative for chest pain, palpitations and leg swelling.  Gastrointestinal:   Negative for abdominal distention, abdominal pain, anal bleeding, blood in stool, constipation, diarrhea, nausea and vomiting.  Musculoskeletal:  Negative for arthralgias and myalgias.  Skin:  Negative for color change and pallor.  Neurological:  Negative for dizziness, syncope and weakness.  Psychiatric/Behavioral:  Negative for confusion. The patient is not nervous/anxious.   All other systems reviewed and are negative.    Medications Current Facility-Administered Medications on File Prior to Encounter  Medication Dose Route Frequency Provider Last Rate Last Admin   sodium chloride  flush (NS) 0.9 % injection 3 mL  3 mL Intravenous Q12H Berle Breeding, PA-C       Current Outpatient Medications on File Prior to Encounter  Medication Sig Dispense Refill   aspirin  81 MG chewable tablet Chew 1 tablet (81 mg total) by mouth daily.     clopidogrel (PLAVIX) 75 MG tablet Take 75 mg by mouth daily.     hydrochlorothiazide  (HYDRODIURIL ) 12.5 MG tablet Take 12.5 mg by mouth daily.     lisinopril  (ZESTRIL ) 20 MG tablet Take 20 mg by mouth daily.     metFORMIN  (GLUCOPHAGE ) 1000 MG tablet Take 1,000 mg by mouth 2 (two) times daily.     metoprolol  succinate (TOPROL -XL) 25 MG 24 hr tablet Take 1 tablet (25 mg total) by mouth daily. 30 tablet 0   pravastatin  (PRAVACHOL ) 40 MG tablet Take 40 mg by mouth daily.     nitroGLYCERIN  (NITROLINGUAL ) 0.4 MG/SPRAY spray Place 1 spray under the tongue every 5 (five) minutes x 3 doses as needed  for chest pain. 12 g 12   ticagrelor  (BRILINTA ) 90 MG TABS tablet Take 1 tablet (90 mg total) by mouth 2 (two) times daily. (Patient not taking: Reported on 08/07/2023) 60 tablet 0    Pertinent medications related to GI and procedure were reviewed by me with the patient prior to the procedure   Current Facility-Administered Medications:    0.9 %  sodium chloride  infusion, , Intravenous, Continuous, Quintin Buckle, DO, Last Rate: 20 mL/hr at 08/14/23 0655, 20 mL/hr  at 08/14/23 2956  Facility-Administered Medications Ordered in Other Encounters:    sodium chloride  flush (NS) 0.9 % injection 3 mL, 3 mL, Intravenous, Q12H, Berle Breeding, PA-C  sodium chloride  20 mL/hr (08/14/23 2130)       Allergies  Allergen Reactions   Prednisone Hives   Liraglutide Other (See Comments)   Semaglutide Diarrhea   Allergies were reviewed by me prior to the procedure  Objective   Body mass index is 26.55 kg/m. Vitals:   08/14/23 0640  BP: 126/83  Pulse: 85  Resp: 20  Temp: (!) 96.8 F (36 C)  TempSrc: Temporal  SpO2: 100%  Weight: 81.6 kg  Height: 5\' 9"  (1.753 m)     Physical Exam Vitals and nursing note reviewed.  Constitutional:      General: He is not in acute distress.    Appearance: Normal appearance. He is not ill-appearing, toxic-appearing or diaphoretic.  HENT:     Head: Normocephalic and atraumatic.     Nose: Nose normal.     Mouth/Throat:     Mouth: Mucous membranes are moist.     Pharynx: Oropharynx is clear.  Eyes:     General: No scleral icterus.    Extraocular Movements: Extraocular movements intact.  Cardiovascular:     Rate and Rhythm: Normal rate and regular rhythm.     Heart sounds: Normal heart sounds. No murmur heard.    No friction rub. No gallop.  Pulmonary:     Effort: Pulmonary effort is normal. No respiratory distress.     Breath sounds: Normal breath sounds. No wheezing, rhonchi or rales.  Abdominal:     General: Abdomen is flat. Bowel sounds are normal. There is no distension.     Palpations: Abdomen is soft.     Tenderness: There is no abdominal tenderness. There is no guarding or rebound.  Musculoskeletal:     Cervical back: Neck supple.     Right lower leg: No edema.     Left lower leg: No edema.  Skin:    General: Skin is warm and dry.     Coloration: Skin is not jaundiced or pale.  Neurological:     General: No focal deficit present.     Mental Status: He is alert and oriented to person, place,  and time. Mental status is at baseline.  Psychiatric:        Mood and Affect: Mood normal.        Behavior: Behavior normal.        Thought Content: Thought content normal.        Judgment: Judgment normal.      Assessment:  Douglas Young is a 53 y.o. male  who presents today for Colonoscopy for Colorectal cancer screening .  Plan:  Colonoscopy with possible intervention today  Colonoscopy with possible biopsy, control of bleeding, polypectomy, and interventions as necessary has been discussed with the patient/patient representative. Informed consent was obtained from the patient/patient representative after explaining the  indication, nature, and risks of the procedure including but not limited to death, bleeding, perforation, missed neoplasm/lesions, cardiorespiratory compromise, and reaction to medications. Opportunity for questions was given and appropriate answers were provided. Patient/patient representative has verbalized understanding is amenable to undergoing the procedure.   Quintin Buckle, DO  [facility] Gastroenterology  Portions of the record may have been created with voice recognition software. Occasional wrong-word or 'sound-a-like' substitutions may have occurred due to the inherent limitations of voice recognition software.  Read the chart carefully and recognize, using context, where substitutions may have occurred.

## 2023-08-14 ENCOUNTER — Ambulatory Visit
Admission: RE | Admit: 2023-08-14 | Discharge: 2023-08-14 | Disposition: A | Discharge status: Home / Self Care | Payer: BC Managed Care – PPO | Attending: Gastroenterology | Admitting: Gastroenterology

## 2023-08-14 ENCOUNTER — Encounter: Payer: Self-pay | Admitting: Gastroenterology

## 2023-08-14 ENCOUNTER — Encounter
Admission: RE | Disposition: A | Discharge status: Home / Self Care | Payer: Self-pay | Source: Home / Self Care | Attending: Gastroenterology

## 2023-08-14 ENCOUNTER — Ambulatory Visit: Admitting: Anesthesiology

## 2023-08-14 DIAGNOSIS — I110 Hypertensive heart disease with heart failure: Secondary | ICD-10-CM | POA: Insufficient documentation

## 2023-08-14 DIAGNOSIS — I252 Old myocardial infarction: Secondary | ICD-10-CM | POA: Diagnosis not present

## 2023-08-14 DIAGNOSIS — I2510 Atherosclerotic heart disease of native coronary artery without angina pectoris: Secondary | ICD-10-CM | POA: Insufficient documentation

## 2023-08-14 DIAGNOSIS — Z1211 Encounter for screening for malignant neoplasm of colon: Secondary | ICD-10-CM | POA: Insufficient documentation

## 2023-08-14 DIAGNOSIS — K640 First degree hemorrhoids: Secondary | ICD-10-CM | POA: Insufficient documentation

## 2023-08-14 DIAGNOSIS — E119 Type 2 diabetes mellitus without complications: Secondary | ICD-10-CM | POA: Insufficient documentation

## 2023-08-14 DIAGNOSIS — D122 Benign neoplasm of ascending colon: Secondary | ICD-10-CM | POA: Insufficient documentation

## 2023-08-14 DIAGNOSIS — I509 Heart failure, unspecified: Secondary | ICD-10-CM | POA: Diagnosis not present

## 2023-08-14 DIAGNOSIS — Z7902 Long term (current) use of antithrombotics/antiplatelets: Secondary | ICD-10-CM | POA: Diagnosis not present

## 2023-08-14 DIAGNOSIS — Z955 Presence of coronary angioplasty implant and graft: Secondary | ICD-10-CM | POA: Insufficient documentation

## 2023-08-14 DIAGNOSIS — Z7984 Long term (current) use of oral hypoglycemic drugs: Secondary | ICD-10-CM | POA: Diagnosis not present

## 2023-08-14 HISTORY — DX: Type 2 diabetes mellitus without complications: E11.9

## 2023-08-14 HISTORY — DX: Mixed hyperlipidemia: E78.2

## 2023-08-14 HISTORY — DX: Ischemic cardiomyopathy: I25.5

## 2023-08-14 HISTORY — PX: COLONOSCOPY WITH PROPOFOL: SHX5780

## 2023-08-14 HISTORY — DX: Acute myocardial infarction, unspecified: I21.9

## 2023-08-14 HISTORY — DX: Atherosclerotic heart disease of native coronary artery without angina pectoris: I25.10

## 2023-08-14 HISTORY — DX: Unspecified systolic (congestive) heart failure: I50.20

## 2023-08-14 HISTORY — DX: Non-ST elevation (NSTEMI) myocardial infarction: I21.4

## 2023-08-14 LAB — GLUCOSE, CAPILLARY: Glucose-Capillary: 103 mg/dL — ABNORMAL HIGH (ref 70–99)

## 2023-08-14 SURGERY — COLONOSCOPY WITH PROPOFOL
Anesthesia: General

## 2023-08-14 MED ORDER — SODIUM CHLORIDE 0.9 % IV SOLN
INTRAVENOUS | Status: DC
  Administered 2023-08-14: 20 mL/h via INTRAVENOUS

## 2023-08-14 MED ORDER — GLYCOPYRROLATE 0.2 MG/ML IJ SOLN
INTRAMUSCULAR | Status: DC | PRN
  Administered 2023-08-14: .1 mg via INTRAVENOUS

## 2023-08-14 MED ORDER — PROPOFOL 10 MG/ML IV BOLUS
INTRAVENOUS | Status: AC
Start: 2023-08-14 — End: ?
  Filled 2023-08-14: qty 20

## 2023-08-14 MED ORDER — PROPOFOL 1000 MG/100ML IV EMUL
INTRAVENOUS | Status: AC
  Filled 2023-08-14: qty 100

## 2023-08-14 MED ORDER — LIDOCAINE HCL (CARDIAC) PF 100 MG/5ML IV SOSY
PREFILLED_SYRINGE | INTRAVENOUS | Status: DC | PRN
  Administered 2023-08-14: 40 mg via INTRAVENOUS

## 2023-08-14 MED ORDER — PROPOFOL 10 MG/ML IV BOLUS
INTRAVENOUS | Status: DC | PRN
  Administered 2023-08-14: 60 mg via INTRAVENOUS

## 2023-08-14 MED ORDER — LIDOCAINE HCL (PF) 2 % IJ SOLN
INTRAMUSCULAR | Status: AC
  Filled 2023-08-14: qty 5

## 2023-08-14 MED ORDER — GLYCOPYRROLATE 0.2 MG/ML IJ SOLN
INTRAMUSCULAR | Status: AC
  Filled 2023-08-14: qty 1

## 2023-08-14 MED ORDER — PROPOFOL 500 MG/50ML IV EMUL
INTRAVENOUS | Status: DC | PRN
  Administered 2023-08-14: 150 ug/kg/min via INTRAVENOUS

## 2023-08-14 NOTE — Transfer of Care (Signed)
 Immediate Anesthesia Transfer of Care Note  Patient: Douglas Young  Procedure(s) Performed: COLONOSCOPY WITH PROPOFOL   Patient Location: PACU  Anesthesia Type:General  Level of Consciousness: awake, drowsy, and patient cooperative  Airway & Oxygen Therapy: Patient Spontanous Breathing and Patient connected to nasal cannula oxygen  Post-op Assessment: Report given to RN and Post -op Vital signs reviewed and stable  Post vital signs: Reviewed and stable  Last Vitals:  Vitals Value Taken Time  BP 103/74 08/14/23 0803  Temp    Pulse 104 08/14/23 0803  Resp 17 08/14/23 0803  SpO2 96 % 08/14/23 0803  Vitals shown include unfiled device data.  Last Pain:  Vitals:   08/14/23 0803  TempSrc:   PainSc: 0-No pain       Patent airway to PACU, breathing spontaneously on 4L O2 via [place]. Patient awake but sleepy. VSS.  Complications: No notable events documented.

## 2023-08-14 NOTE — Anesthesia Postprocedure Evaluation (Signed)
 Anesthesia Post Note  Patient: Douglas Young  Procedure(s) Performed: COLONOSCOPY WITH PROPOFOL   Patient location during evaluation: Endoscopy Anesthesia Type: General Level of consciousness: awake and alert Pain management: pain level controlled Vital Signs Assessment: post-procedure vital signs reviewed and stable Respiratory status: spontaneous breathing, nonlabored ventilation, respiratory function stable and patient connected to nasal cannula oxygen Cardiovascular status: blood pressure returned to baseline and stable Postop Assessment: no apparent nausea or vomiting Anesthetic complications: no   No notable events documented.   Last Vitals:  Vitals:   08/14/23 0811 08/14/23 0818  BP: 107/72 121/74  Pulse: 97 84  Resp: 17 16  Temp:    SpO2: 99% 97%    Last Pain:  Vitals:   08/14/23 0818  TempSrc:   PainSc: 0-No pain                 Nancey Awkward

## 2023-08-14 NOTE — Anesthesia Preprocedure Evaluation (Signed)
 Anesthesia Evaluation  Patient identified by MRN, date of birth, ID band Patient awake    Reviewed: Allergy & Precautions, NPO status , Patient's Chart, lab work & pertinent test results  History of Anesthesia Complications Negative for: history of anesthetic complications  Airway Mallampati: II  TM Distance: >3 FB Neck ROM: full    Dental no notable dental hx.    Pulmonary neg pulmonary ROS   Pulmonary exam normal        Cardiovascular hypertension, + CAD, + Past MI, + Cardiac Stents and +CHF  Normal cardiovascular exam  Echo 2023  INTERPRETATION  NORMAL LEFT VENTRICULAR SYSTOLIC FUNCTION  NORMAL RIGHT VENTRICULAR SYSTOLIC FUNCTION  TRIVIAL REGURGITATION NOTED (See above)  NO VALVULAR STENOSIS  TRIVIAL MR, TR ,PR  EF 50-55%     Neuro/Psych Seizures -,   negative psych ROS   GI/Hepatic negative GI ROS, Neg liver ROS,,,  Endo/Other  negative endocrine ROSdiabetes    Renal/GU negative Renal ROS  negative genitourinary   Musculoskeletal   Abdominal   Peds  Hematology negative hematology ROS (+)   Anesthesia Other Findings Past Medical History: No date: Coronary artery disease No date: Diabetes mellitus without complication (HCC) No date: DM2 (diabetes mellitus, type 2) (HCC) No date: Heart failure with reduced ejection fraction (HCC) No date: Hypertension No date: Ischemic cardiomyopathy No date: Mixed hyperlipidemia No date: Myocardial infarction (HCC) No date: NSTEMI (non-ST elevated myocardial infarction) (HCC) No date: Seizures (HCC)  Past Surgical History: No date: BACK SURGERY 04/05/2019: CORONARY STENT INTERVENTION; N/A     Comment:  Procedure: CORONARY STENT INTERVENTION;  Surgeon:               Percival Brace, MD;  Location: [facility] INVASIVE CV               LAB;  Service: Cardiovascular;  Laterality: N/A; 04/05/2019: LEFT HEART CATH AND CORONARY ANGIOGRAPHY; N/A     Comment:  Procedure:  LEFT HEART CATH AND CORONARY ANGIOGRAPHY;                Surgeon: Ronney Cola, MD;  Location: [facility] INVASIVE CV              LAB;  Service: Cardiovascular;  Laterality: N/A; No date: SKIN GRAFT  BMI    Body Mass Index: 26.55 kg/m      Reproductive/Obstetrics negative OB ROS                              Anesthesia Physical Anesthesia Plan  ASA: 3  Anesthesia Plan: General   Post-op Pain Management: Minimal or no pain anticipated   Induction: Intravenous  PONV Risk Score and Plan: 1 and Propofol  infusion and TIVA  Airway Management Planned: Natural Airway and Nasal Cannula  Additional Equipment:   Intra-op Plan:   Post-operative Plan:   Informed Consent: I have reviewed the patients History and Physical, chart, labs and discussed the procedure including the risks, benefits and alternatives for the proposed anesthesia with the patient or authorized representative who has indicated his/her understanding and acceptance.     Dental Advisory Given  Plan Discussed with: Anesthesiologist, CRNA and Surgeon  Anesthesia Plan Comments: (Patient consented for risks of anesthesia including but not limited to:  - adverse reactions to medications - risk of airway placement if required - damage to eyes, teeth, lips or other oral mucosa - nerve damage due to positioning  - sore throat or hoarseness -  Damage to heart, brain, nerves, lungs, other parts of body or loss of life  Patient voiced understanding and assent.)         Anesthesia Quick Evaluation

## 2023-08-14 NOTE — Interval H&P Note (Signed)
 History and Physical Interval Note: Preprocedure H&P from 08/14/23  was reviewed and there was no interval change after seeing and examining the patient.  Written consent was obtained from the patient after discussion of risks, benefits, and alternatives. Patient has consented to proceed with Colonoscopy with possible intervention   08/14/2023 7:31 AM  Douglas Young  has presented today for surgery, with the diagnosis of Colon Cancer Screening.  The various methods of treatment have been discussed with the patient and family. After consideration of risks, benefits and other options for treatment, the patient has consented to  Procedure(s) with comments: COLONOSCOPY WITH PROPOFOL  (N/A) - DM, PLAVIX as a surgical intervention.  The patient's history has been reviewed, patient examined, no change in status, stable for surgery.  I have reviewed the patient's chart and labs.  Questions were answered to the patient's satisfaction.     Quintin Buckle

## 2023-08-14 NOTE — Op Note (Signed)
 [facility] Gastroenterology Patient Name: Douglas Young Procedure Date: 08/14/2023 7:33 AM MRN: [id number] Account #: [id number] Date of Birth: 1970-06-11 Admit Type: Outpatient Age: 53 Room: [facility] ENDO ROOM 1 Gender: Male Note Status: Finalized Instrument Name: Colonoscope [id number] Procedure:             Colonoscopy Indications:           Screening for colorectal malignant neoplasm Providers:             Bridgett Camps, DO Referring MD:          Quintin Buckle DO, DO (Referring MD), No Local                         Md, MD (Referring MD) Medicines:             Monitored Anesthesia Care Complications:         No immediate complications. Estimated blood loss:                         Minimal. Procedure:             Pre-Anesthesia Assessment:                        - Prior to the procedure, a History and Physical was                         performed, and patient medications and allergies were                         reviewed. The patient is competent. The risks and                         benefits of the procedure and the sedation options and                         risks were discussed with the patient. All questions                         were answered and informed consent was obtained.                         Patient identification and proposed procedure were                         verified by the physician, the nurse, the anesthetist                         and the technician in the endoscopy suite. Mental                         Status Examination: alert and oriented. Airway                         Examination: normal oropharyngeal airway and neck                         mobility. Respiratory Examination: clear to  auscultation. CV Examination: RRR, no murmurs, no S3                         or S4. Prophylactic Antibiotics: The patient does not                         require prophylactic antibiotics. Prior                          Anticoagulants: The patient has taken Plavix                         (clopidogrel), last dose was 7 days prior to                         procedure. ASA Grade Assessment: III - A patient with                         severe systemic disease. After reviewing the risks and                         benefits, the patient was deemed in satisfactory                         condition to undergo the procedure. The anesthesia                         plan was to use monitored anesthesia care (MAC).                         Immediately prior to administration of medications,                         the patient was re-assessed for adequacy to receive                         sedatives. The heart rate, respiratory rate, oxygen                         saturations, blood pressure, adequacy of pulmonary                         ventilation, and response to care were monitored                         throughout the procedure. The physical status of the                         patient was re-assessed after the procedure.                        After obtaining informed consent, the colonoscope was                         passed under direct vision. Throughout the procedure,                         the patient's blood pressure, pulse, and oxygen  saturations were monitored continuously. The                         Colonoscope was introduced through the anus and                         advanced to the the terminal ileum, with                         identification of the appendiceal orifice and IC                         valve. The colonoscopy was performed without                         difficulty. The patient tolerated the procedure well.                         The quality of the bowel preparation was evaluated                         using the BBPS [facility] Bowel Preparation Scale) with                         scores of: Right Colon = 3, Transverse Colon = 3 and                          Left Colon = 3 (entire mucosa seen well with no                         residual staining, small fragments of stool or opaque                         liquid). The total BBPS score equals 9. The terminal                         ileum, ileocecal valve, appendiceal orifice, and                         rectum were photographed. Findings:      The perianal and digital rectal examinations were normal. Pertinent       negatives include normal sphincter tone.      The terminal ileum appeared normal. Estimated blood loss: none.      Retroflexion in the right colon was performed.      A 1 to 2 mm polyp was found in the proximal ascending colon. The polyp       was sessile. The polyp was removed with a jumbo cold forceps. Resection       and retrieval were complete. Estimated blood loss was minimal.      Non-bleeding internal hemorrhoids were found during retroflexion. The       hemorrhoids were Grade I (internal hemorrhoids that do not prolapse).       Estimated blood loss: none.      The exam was otherwise without abnormality on direct and retroflexion       views. Impression:            - The examined portion of the ileum  was normal.                        - One 1 to 2 mm polyp in the proximal ascending colon,                         removed with a jumbo cold forceps. Resected and                         retrieved.                        - Non-bleeding internal hemorrhoids.                        - The examination was otherwise normal on direct and                         retroflexion views. Recommendation:        - Patient has a contact number available for                         emergencies. The signs and symptoms of potential                         delayed complications were discussed with the patient.                         Return to normal activities tomorrow. Written                         discharge instructions were provided to the patient.                        - Discharge patient  to home.                        - Resume previous diet.                        - Continue present medications.                        - Resume Plavix (clopidogrel) at prior dose tomorrow.                         Refer to managing physician for further adjustment of                         therapy.                        - Await pathology results.                        - Repeat colonoscopy in 7 years for surveillance based                         on pathology results.                        - Return  to referring physician as previously                         scheduled.                        - The findings and recommendations were discussed with                         the patient. Procedure Code(s):     --- Professional ---                        [id number], Colonoscopy, flexible; with biopsy, single or                         multiple Diagnosis Code(s):     --- Professional ---                        Z12.11, Encounter for screening for malignant neoplasm                         of colon                        K64.0, First degree hemorrhoids                        D12.2, Benign neoplasm of ascending colon CPT copyright 2022 [employer]. All rights reserved. The codes documented in this report are preliminary and upon coder review may  be revised to meet current compliance requirements. Attending Participation:      I personally performed the entire procedure. Polo Brisk, DO Quintin Buckle DO, DO 08/14/2023 8:00:27 AM This report has been signed electronically. Number of Addenda: 0 Note Initiated On: 08/14/2023 7:33 AM Scope Withdrawal Time: 0 hours 13 minutes 41 seconds  Total Procedure Duration: 0 hours 16 minutes 36 seconds  Estimated Blood Loss:  Estimated blood loss was minimal.      [facility]

## 2023-08-17 LAB — SURGICAL PATHOLOGY
